# Patient Record
Sex: Female | Born: 1980 | Race: White | Hispanic: No | Marital: Married | Smoking: Never smoker
Health system: Southern US, Community
[De-identification: ages and names within clinical notes are randomized; demographics above are authoritative.]

## PROBLEM LIST (undated history)

## (undated) ENCOUNTER — Inpatient Hospital Stay (HOSPITAL_COMMUNITY): Payer: Self-pay

## (undated) DIAGNOSIS — O4290 Premature rupture of membranes, unspecified as to length of time between rupture and onset of labor, unspecified weeks of gestation: Secondary | ICD-10-CM

## (undated) DIAGNOSIS — N809 Endometriosis, unspecified: Secondary | ICD-10-CM

## (undated) DIAGNOSIS — O99019 Anemia complicating pregnancy, unspecified trimester: Secondary | ICD-10-CM

## (undated) DIAGNOSIS — N979 Female infertility, unspecified: Secondary | ICD-10-CM

## (undated) DIAGNOSIS — D62 Acute posthemorrhagic anemia: Secondary | ICD-10-CM

## (undated) DIAGNOSIS — O133 Gestational [pregnancy-induced] hypertension without significant proteinuria, third trimester: Secondary | ICD-10-CM

## (undated) DIAGNOSIS — D509 Iron deficiency anemia, unspecified: Secondary | ICD-10-CM

## (undated) DIAGNOSIS — J45909 Unspecified asthma, uncomplicated: Secondary | ICD-10-CM

## (undated) HISTORY — PX: WISDOM TOOTH EXTRACTION: SHX21

## (undated) HISTORY — DX: Endometriosis, unspecified: N80.9

## (undated) HISTORY — PX: CHOLECYSTECTOMY: SHX55

## (undated) HISTORY — PX: LAPAROSCOPY: SHX197

## (undated) HISTORY — PX: NASAL SINUS SURGERY: SHX719

## (undated) HISTORY — PX: KNEE ARTHROSCOPY: SHX127

---

## 2013-02-23 ENCOUNTER — Other Ambulatory Visit: Payer: Self-pay | Admitting: Obstetrics and Gynecology

## 2013-02-23 DIAGNOSIS — N6452 Nipple discharge: Secondary | ICD-10-CM

## 2013-03-13 ENCOUNTER — Other Ambulatory Visit: Payer: Self-pay

## 2013-03-28 ENCOUNTER — Other Ambulatory Visit: Payer: Self-pay

## 2013-03-28 ENCOUNTER — Inpatient Hospital Stay: Admission: RE | Admit: 2013-03-28 | Payer: Self-pay | Source: Ambulatory Visit

## 2013-08-13 ENCOUNTER — Encounter (HOSPITAL_COMMUNITY): Payer: Self-pay | Admitting: Emergency Medicine

## 2013-08-13 ENCOUNTER — Emergency Department (HOSPITAL_COMMUNITY): Payer: 59

## 2013-08-13 ENCOUNTER — Emergency Department (HOSPITAL_COMMUNITY)
Admission: EM | Admit: 2013-08-13 | Discharge: 2013-08-13 | Disposition: A | Discharge status: Home / Self Care | Payer: 59 | Attending: Emergency Medicine | Admitting: Emergency Medicine

## 2013-08-13 DIAGNOSIS — J45909 Unspecified asthma, uncomplicated: Secondary | ICD-10-CM | POA: Insufficient documentation

## 2013-08-13 DIAGNOSIS — J029 Acute pharyngitis, unspecified: Secondary | ICD-10-CM | POA: Insufficient documentation

## 2013-08-13 DIAGNOSIS — J3489 Other specified disorders of nose and nasal sinuses: Secondary | ICD-10-CM | POA: Insufficient documentation

## 2013-08-13 DIAGNOSIS — R69 Illness, unspecified: Secondary | ICD-10-CM

## 2013-08-13 DIAGNOSIS — B9789 Other viral agents as the cause of diseases classified elsewhere: Secondary | ICD-10-CM | POA: Insufficient documentation

## 2013-08-13 DIAGNOSIS — R05 Cough: Secondary | ICD-10-CM | POA: Insufficient documentation

## 2013-08-13 DIAGNOSIS — R51 Headache: Secondary | ICD-10-CM | POA: Insufficient documentation

## 2013-08-13 DIAGNOSIS — R059 Cough, unspecified: Secondary | ICD-10-CM | POA: Insufficient documentation

## 2013-08-13 DIAGNOSIS — J111 Influenza due to unidentified influenza virus with other respiratory manifestations: Secondary | ICD-10-CM

## 2013-08-13 DIAGNOSIS — IMO0002 Reserved for concepts with insufficient information to code with codable children: Secondary | ICD-10-CM | POA: Insufficient documentation

## 2013-08-13 DIAGNOSIS — IMO0001 Reserved for inherently not codable concepts without codable children: Secondary | ICD-10-CM | POA: Insufficient documentation

## 2013-08-13 HISTORY — DX: Unspecified asthma, uncomplicated: J45.909

## 2013-08-13 MED ORDER — GUAIFENESIN ER 1200 MG PO TB12: 1.0000 | ORAL_TABLET | Freq: Two times a day (BID) | ORAL | Status: DC

## 2013-08-13 MED ORDER — PROMETHAZINE-DM 6.25-15 MG/5ML PO SYRP: 5.0000 mL | ORAL_SOLUTION | Freq: Four times a day (QID) | ORAL | Status: DC | PRN

## 2013-08-13 MED ORDER — IBUPROFEN 800 MG PO TABS: 800.0000 mg | ORAL_TABLET | Freq: Three times a day (TID) | ORAL | Status: DC | PRN

## 2013-08-13 NOTE — ED Provider Notes (Signed)
CSN: [id number]     Arrival date & time 08/13/13  1956 History   First MD Initiated Contact with Patient 08/13/13 2057     Chief Complaint  Patient presents with  . Fever    pre tylenol at home   (Consider location/radiation/quality/duration/timing/severity/associated sxs/prior Treatment) HPI Patient presents emergency department with headache, cough, runny nose, sore throat body aches, and fever, that started one day ago.  The patient, states, that she does not have chest pain, shortness of breath, vomiting, diarrhea, weakness, dizziness, blurred vision, rash, or syncope.  Patient, states she took some expiratory arrival.  Patient, states nothing seems to make her condition, better or worse Past Medical History  Diagnosis Date  . Asthma    Past Surgical History  Procedure Laterality Date  . Nasal sinus surgery    . Cesarean section    . Cholecystectomy     History reviewed. No pertinent family history. History  Substance Use Topics  . Smoking status: Never Smoker   . Smokeless tobacco: Not on file  . Alcohol Use: No   OB History   Grav Para Term Preterm Abortions TAB SAB Ect Mult Living                 Review of Systems All other systems negative except as documented in the HPI. All pertinent positives and negatives as reviewed in the HPI. Allergies  Augmentin; Biaxin; and Keflex  Home Medications   Current Outpatient Rx  Name  Route  Sig  Dispense  Refill  . budesonide-formoterol (SYMBICORT) 80-4.5 MCG/ACT inhaler   Inhalation   Inhale 2 puffs into the lungs 2 (two) times daily.         . Prenatal Multivit-Min-Fe-FA (PRE-NATAL PO)   Oral   Take 1 tablet by mouth daily.          BP 135/57  Pulse 91  Temp(Src) 98.8 F (37.1 C) (Oral)  Resp 15  Ht 5\' 8"  (1.727 m)  Wt 200 lb (90.719 kg)  BMI 30.42 kg/m2  SpO2 98%  LMP 08/10/2013 Physical Exam  Nursing note and vitals reviewed. Constitutional: She is oriented to person, place, and time. She appears  well-developed and well-nourished. No distress.  HENT:  Head: Normocephalic and atraumatic.  Mouth/Throat: Oropharynx is clear and moist.  Eyes: Pupils are equal, round, and reactive to light.  Neck: Normal range of motion. Neck supple.  Cardiovascular: Normal rate, regular rhythm and normal heart sounds.  Exam reveals no gallop and no friction rub.   No murmur heard. Pulmonary/Chest: Effort normal and breath sounds normal. No respiratory distress. She has no wheezes.  Neurological: She is alert and oriented to person, place, and time.  Skin: Skin is warm and dry.    ED Course  Procedures (including critical care time) Labs Review Labs Reviewed - No data to display Imaging Review Dg Chest 2 View  08/13/2013   CLINICAL DATA:  Cough and fever.  EXAM: CHEST  2 VIEW  COMPARISON:  None.  FINDINGS: The heart size and mediastinal contours are within normal limits. Both lungs are clear. The visualized skeletal structures are unremarkable. Cholecystectomy changes.  IMPRESSION: No active cardiopulmonary disease.   Electronically Signed   By: Tiburcio Pea M.D.   On: 08/13/2013 23:05   patient will be treated for influenza-like illness.  Told to return here as needed.  I have advised her to increase her fluid intake, and rest as much as possible.  Patient is advised followup with her primary  care Dr.    Carlyle Dollyhristopher W Caron Tardif, PA-C 08/13/13 [phone]

## 2013-08-13 NOTE — Discharge Instructions (Signed)
Return here as needed.  Increase your fluid intake, and rest as much possible. Your Chest x-ray did not show any abnormality

## 2013-08-13 NOTE — ED Provider Notes (Signed)
Medical screening examination/treatment/procedure(s) were performed by non-physician practitioner and as supervising physician I was immediately available for consultation/collaboration.   Celene KrasJon R Izik Bingman, MD 08/13/13 [phone]

## 2013-08-13 NOTE — ED Notes (Addendum)
Patient requested to undress and change into a gown. Given warm blanket. Patient does not want IV fluids, PA notified.

## 2013-08-13 NOTE — ED Notes (Signed)
Patient c/o body aches, chills, cough, fever, stuffy nose x2 days. PA at bedside.

## 2013-08-13 NOTE — ED Notes (Signed)
Patient is alert and oriented x3.  She is complaining of flu like symptoms with a fever that started yesterday. She states that she started feeling bad yesterday morning.  Today she spiked a fever of 102.0 and she  Took tylenol at home and is currently afebrile.

## 2014-03-16 IMAGING — CR DG CHEST 2V
2 series · 2 of 2 positions shown · non-contrast
Comparison: None.

CLINICAL DATA: Cough and fever.

EXAM:
CHEST  2 VIEW

[w chest pa]
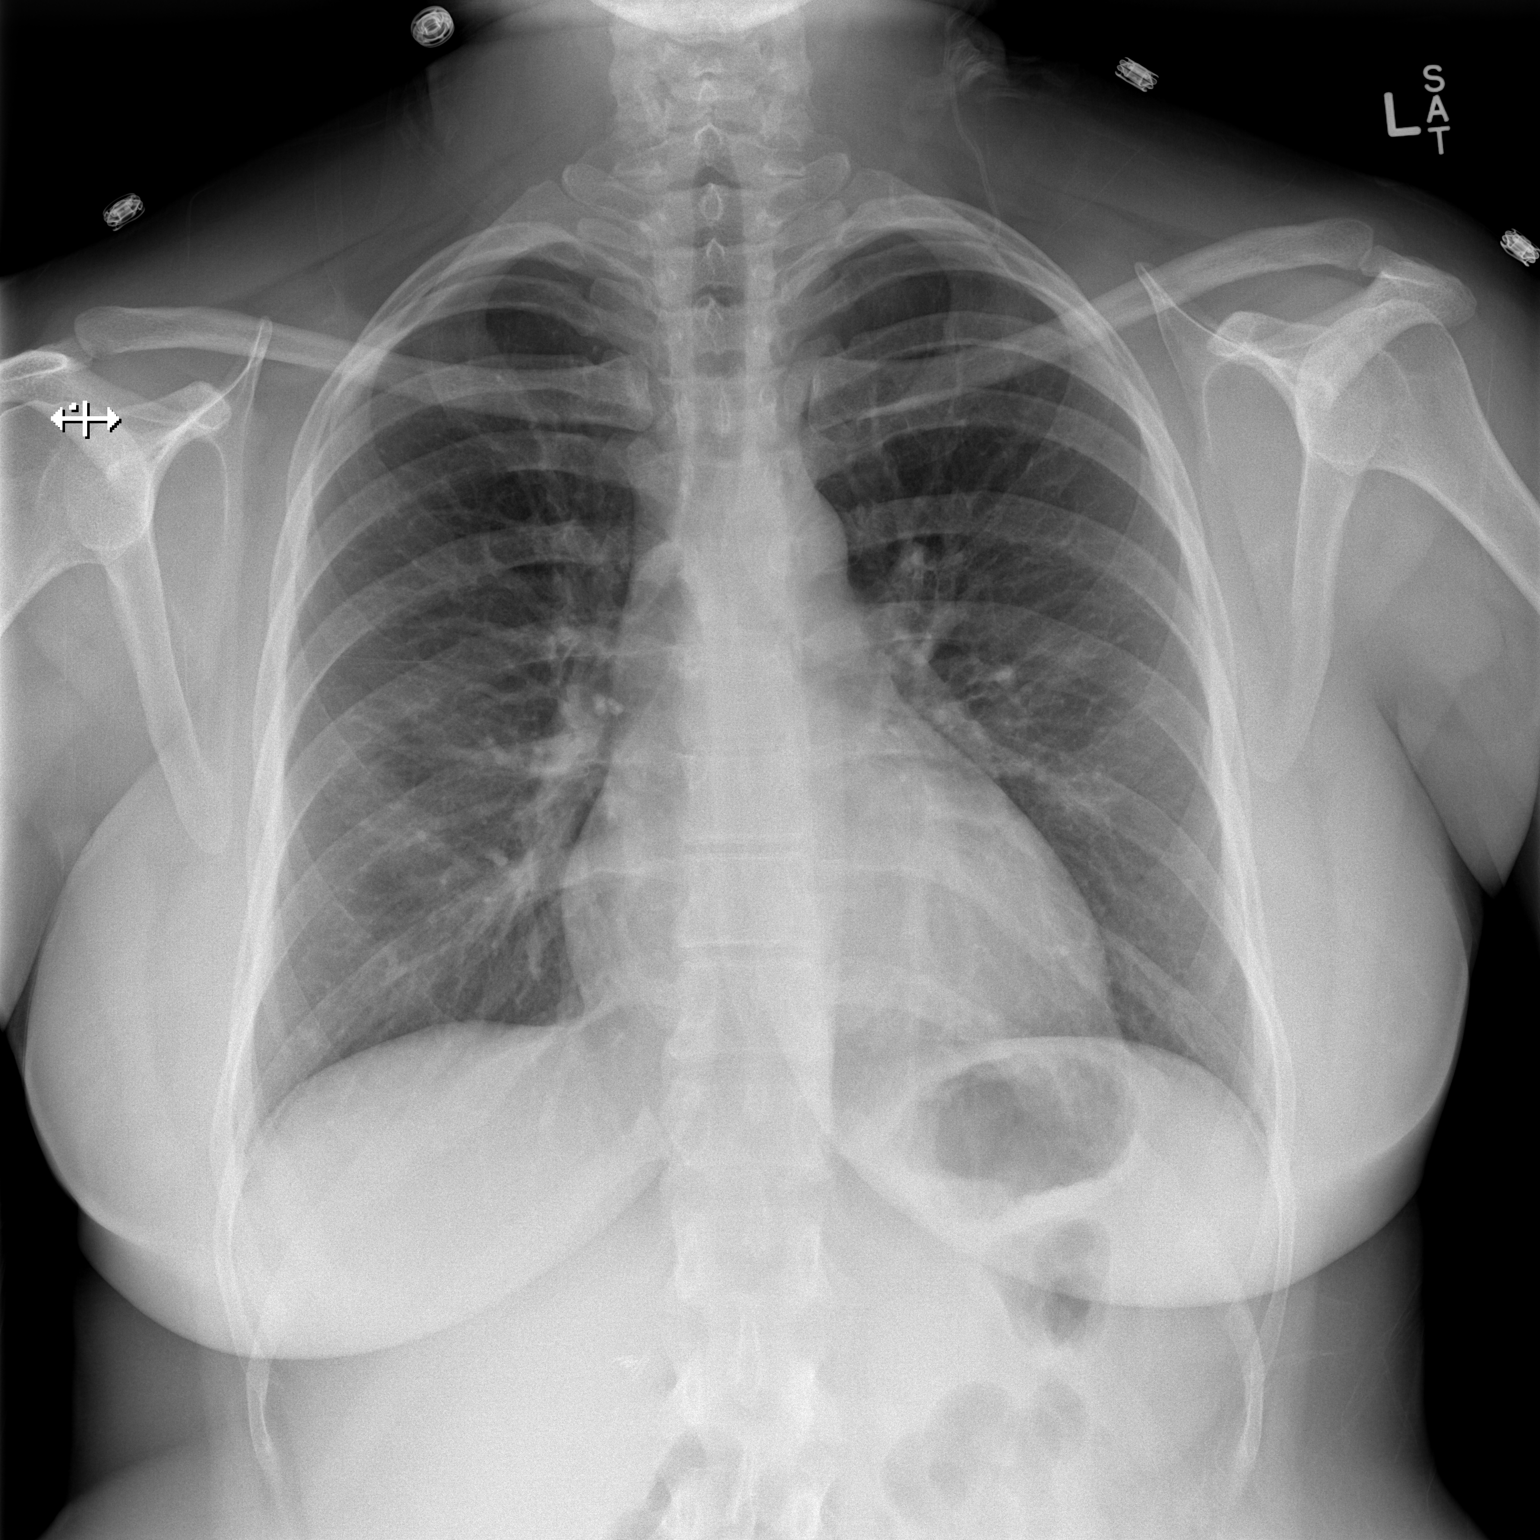

[w chest lat]
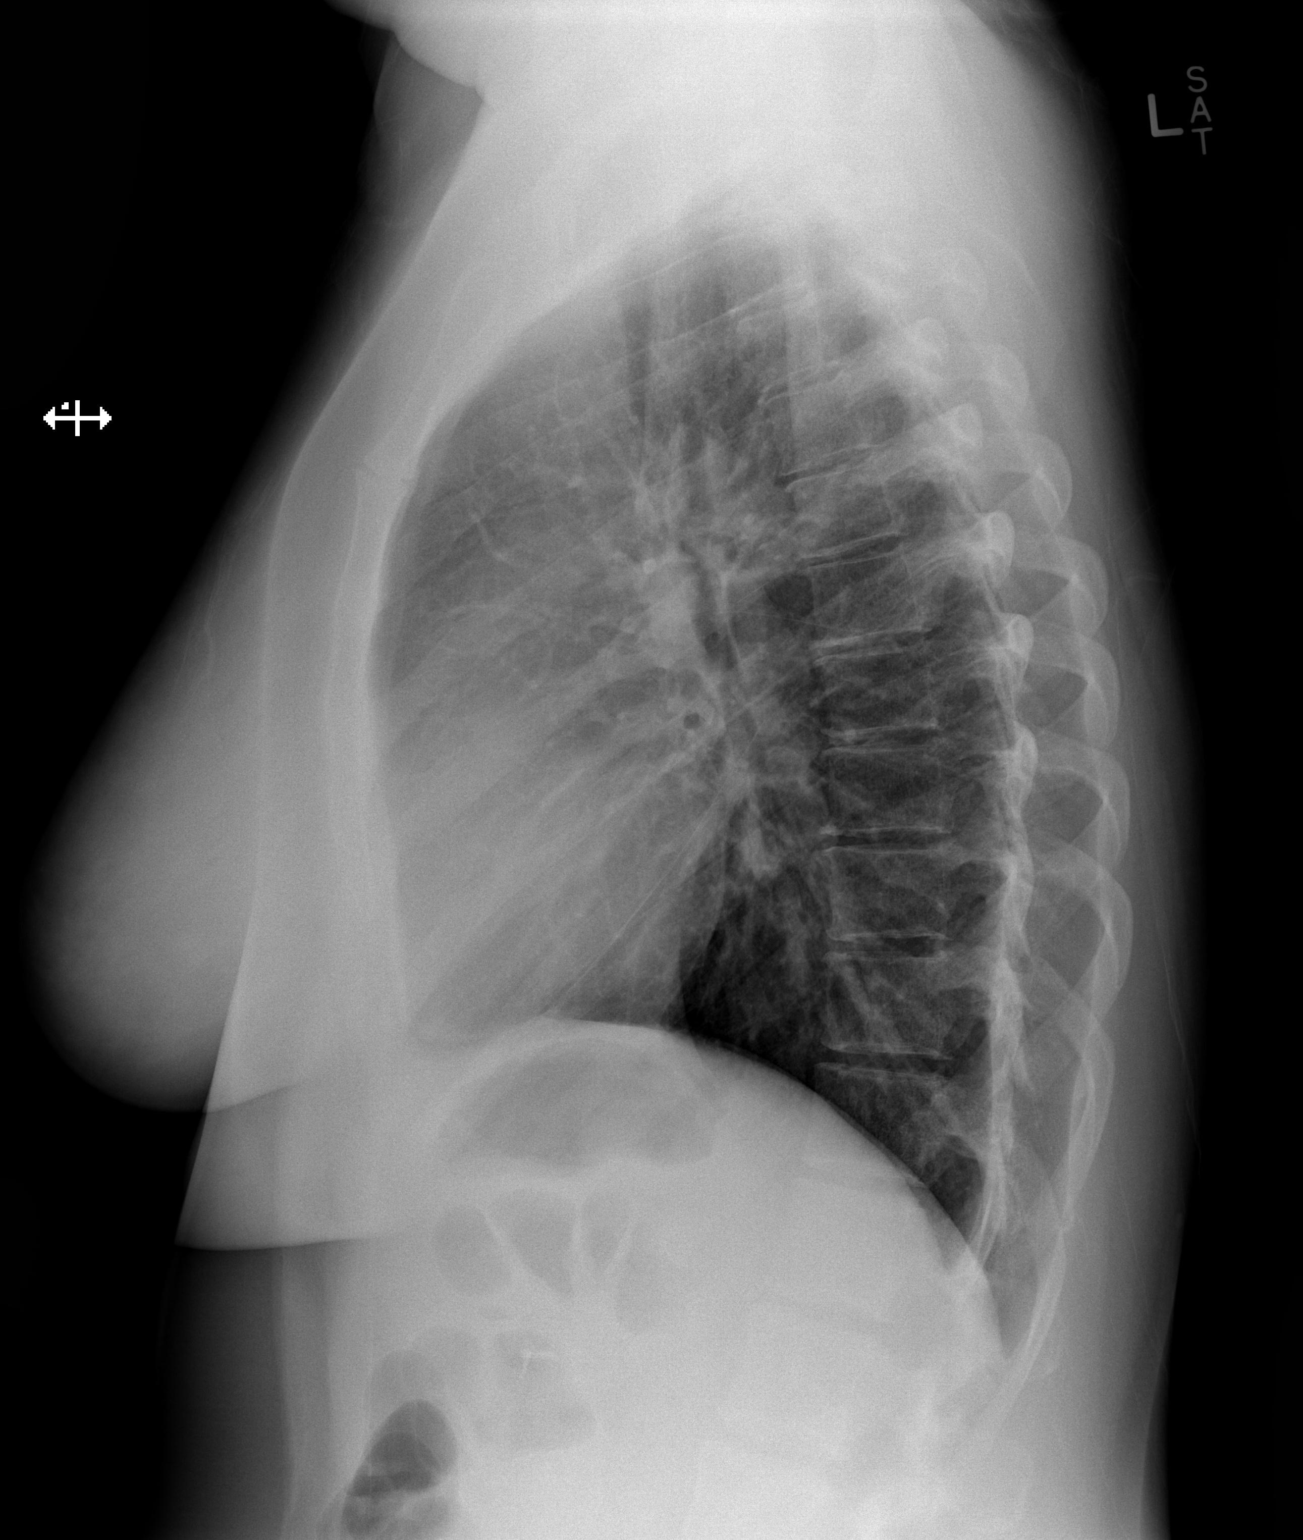

[2 of 2 positions shown; findings below may reference images not displayed]

FINDINGS: The heart size and mediastinal contours are within normal limits.
Both lungs are clear. The visualized skeletal structures are
unremarkable. Cholecystectomy changes.
IMPRESSION: No active cardiopulmonary disease.

## 2014-05-10 ENCOUNTER — Ambulatory Visit: Payer: 59 | Admitting: Family Medicine

## 2014-07-10 ENCOUNTER — Ambulatory Visit: Payer: 59 | Admitting: Family Medicine

## 2014-10-04 ENCOUNTER — Ambulatory Visit: Payer: Self-pay | Admitting: Family Medicine

## 2014-10-16 ENCOUNTER — Ambulatory Visit (INDEPENDENT_AMBULATORY_CARE_PROVIDER_SITE_OTHER): Payer: 59 | Admitting: Family Medicine

## 2014-10-16 ENCOUNTER — Encounter: Payer: Self-pay | Admitting: Family Medicine

## 2014-10-16 VITALS — BP 128/84 | HR 78 | Temp 98.1°F | Ht 66.25 in | Wt 226.8 lb

## 2014-10-16 DIAGNOSIS — G43009 Migraine without aura, not intractable, without status migrainosus: Secondary | ICD-10-CM

## 2014-10-16 DIAGNOSIS — J302 Other seasonal allergic rhinitis: Secondary | ICD-10-CM

## 2014-10-16 DIAGNOSIS — J4520 Mild intermittent asthma, uncomplicated: Secondary | ICD-10-CM

## 2014-10-16 DIAGNOSIS — E669 Obesity, unspecified: Secondary | ICD-10-CM

## 2014-10-16 DIAGNOSIS — G43909 Migraine, unspecified, not intractable, without status migrainosus: Secondary | ICD-10-CM | POA: Insufficient documentation

## 2014-10-16 NOTE — Progress Notes (Signed)
Pre visit review using our clinic review tool, if applicable. No additional management support is needed unless otherwise documented below in the visit note. 

## 2014-10-16 NOTE — Patient Instructions (Signed)
We recommend the following healthy lifestyle measures: - eat a healthy diet consisting of lots of vegetables, fruits, beans, nuts, seeds, healthy meats such as white chicken and fish and whole grains.  - avoid fried foods, fast food, processed foods, sodas, red meet and other fattening foods.  - get a least 150 minutes of aerobic exercise per week.     

## 2014-10-16 NOTE — Progress Notes (Signed)
HPI:  Diane Lester is here to establish care. Moved here a few years ago. Last PCP and physical: sees Dr. Algie Coffer - UTD on physicals there - trying to conceive. She will check lipids and diabetes screening at her upcoming physical. She has done this with her job as well last year and normal.  Has the following chronic problems that require follow up and concerns today:  Migraines: -essential oils and tylenol help -on topamax in the past but does not want to take this as is trying to conceive -chronic, 2 per month now and this is normal for her, usually posterior, some nausea with them -denies change in migraines, vision changes  Obesity: -has lost sig weight since christmas - has changed diet dramatically, cut out sugar, and increased activity -feels great  Seasonal allergies/asthma: -uses flonase intermittently, singulair at times -doing ok right now -has mild asthma symptoms a few times per year and uses albuterol for this -no SOB, DOE, wheezing now  ROS negative for unless reported above: fevers, unintentional weight loss, hearing or vision loss, chest pain, palpitations, struggling to breath, hemoptysis, melena, hematochezia, hematuria, falls, loc, si, thoughts of self harm  Past Medical History  Diagnosis Date  . Asthma     Past Surgical History  Procedure Laterality Date  . Nasal sinus surgery    . Cesarean section    . Cholecystectomy      Family History  Problem Relation Age of Onset  . Diabetes Mother   . Diabetes Maternal Grandfather   . Diabetes Paternal Grandfather     History   Social History  . Marital Status: Married    Spouse Name: N/A  . Number of Children: N/A  . Years of Education: N/A   Social History Main Topics  . Smoking status: Never Smoker   . Smokeless tobacco: Not on file  . Alcohol Use: No  . Drug Use: Not on file  . Sexual Activity: Not on file   Other Topics Concern  . None   Social History Narrative   Work or School:  homemaker      Home Situation: lives with husband and 3 yo son      Spiritual Beliefs: Christian      Lifestyle: starting to get regular exercise; eating healthy           Current outpatient prescriptions:  .  Prenatal Multivit-Min-Fe-FA (PRE-NATAL PO), Take 1 tablet by mouth daily., Disp: , Rfl:   EXAM:  Filed Vitals:   10/16/14 1606  BP: 128/84  Pulse: 78  Temp: 98.1 F (36.7 C)    Body mass index is 36.32 kg/(m^2).  GENERAL: vitals reviewed and listed above, alert, oriented, appears well hydrated and in no acute distress  HEENT: atraumatic, conjunttiva clear, no obvious abnormalities on inspection of external nose and ears  NECK: no obvious masses on inspection  LUNGS: clear to auscultation bilaterally, no wheezes, rales or rhonchi, good air movement  CV: HRRR, no peripheral edema  MS: moves all extremities without noticeable abnormality  PSYCH: pleasant and cooperative, no obvious depression or anxiety  ASSESSMENT AND PLAN:  Discussed the following assessment and plan:  Migraine without aura and without status migrainosus, not intractable -discussed options, she will discuss further with ob, tylenol and essential oils are helping so would stick with this as symptoms are infrequent  Obesity -congratulated on lifestyle changes  Asthma, mild intermittent, uncomplicated Seasonal allergies   -We reviewed the PMH, PSH, FH, SH, Meds and Allergies. -We  provided refills for any medications we will prescribe as needed. -We addressed current concerns per orders and patient instructions. -We have asked for records for pertinent exams, studies, vaccines and notes from previous providers. -We have advised patient to follow up per instructions below.   -Patient advised to return or notify a doctor immediately if symptoms worsen or persist or new concerns arise.  There are no Patient Instructions on file for this visit.   Kriste BasqueKIM, Safir Michalec R.

## 2014-11-13 ENCOUNTER — Encounter: Payer: Self-pay | Admitting: Obstetrics & Gynecology

## 2014-11-13 ENCOUNTER — Ambulatory Visit (INDEPENDENT_AMBULATORY_CARE_PROVIDER_SITE_OTHER): Payer: 59 | Admitting: Obstetrics & Gynecology

## 2014-11-13 VITALS — BP 127/82 | HR 89 | Resp 16 | Ht 66.25 in | Wt 225.0 lb

## 2014-11-13 DIAGNOSIS — N809 Endometriosis, unspecified: Secondary | ICD-10-CM | POA: Diagnosis not present

## 2014-11-13 DIAGNOSIS — Z8759 Personal history of other complications of pregnancy, childbirth and the puerperium: Secondary | ICD-10-CM

## 2014-11-13 DIAGNOSIS — Z124 Encounter for screening for malignant neoplasm of cervix: Secondary | ICD-10-CM

## 2014-11-13 DIAGNOSIS — Z1151 Encounter for screening for human papillomavirus (HPV): Secondary | ICD-10-CM

## 2014-11-13 DIAGNOSIS — Z01419 Encounter for gynecological examination (general) (routine) without abnormal findings: Secondary | ICD-10-CM | POA: Diagnosis not present

## 2014-11-13 NOTE — Progress Notes (Signed)
  Subjective:     Diane Lester is a 34 y.o. female here for a routine exam.  Current complaints: establish care, discuss conceiving.  Pt hsa been having unprotected sex for 3 months but without regular timed intercourse.  Pt has history of endometriosis with 2 laparoscopies in [place].  Pt did not have problems conceiving with first child in 2012.  Pt has regular menses q 31-33 days.  No pain with menses.  Pt had preeclampsia with first pregnancy at primary c/s at 36 weeks.  No labor.  Not breech.  Pt denies GDM.  Personal health questionnaire reviewed: yes.   Gynecologic History Patient's last menstrual period was 10/17/2014. Contraception: none Last Pap: 2014. Results were: normal Last mammogram: n/a  Obstetric History OB History  Gravida Para Term Preterm AB SAB TAB Ectopic Multiple Living  1 1  1      1     # Outcome Date GA Lbr Len/2nd Weight Sex Delivery Anes PTL Lv  1 Preterm 08/10/11 [id number]    [id number]-[id number]        Comments: PIH       The following portions of the patient's history were reviewed and updated as appropriate: allergies, current medications, past family history, past medical history, past social history, past surgical history and problem list.  Review of Systems Pertinent items are noted in HPI.    Objective:   Filed Vitals:   11/13/14 0842  BP: 127/82  Pulse: 89  Resp: 16  Height: 5' 6.25" (1.683 m)  Weight: 225 lb (102.059 kg)   Vitals:  WNL General appearance: alert, cooperative and no distress Head: Normocephalic, without obvious abnormality, atraumatic Eyes: negative Throat: lips, mucosa, and tongue normal; teeth and gums normal Lungs: clear to auscultation bilaterally Breasts: normal appearance, no masses or tenderness, No nipple retraction or dimpling, No nipple discharge or bleeding Heart: regular rate and rhythm Abdomen: soft, non-tender; bowel sounds normal; no masses,  no organomegaly  Pelvic:  External Genitalia:  Tanner V, no  lesion Urethra:  No prolapse Vagina:  Pink, normal rugae, no blood or discharge Cervix:  No CMT, no lesion Uterus:  Normal size and contour, non tender; slightly retroverted. Adnexa:  Normal, no masses, non tender  Extremities: no edema, redness or tenderness in the calves or thighs Skin: no lesions or rash Lymph nodes: Axillary adenopathy: none         Assessment:    Healthy female exam.    Plan:    Education reviewed: low fat, low cholesterol diet and skin cancer screening. Contraception: none. Follow up in: 1 year. timed intercourse for 6 months  PNV Continue weight loss (lost 40+ pounds this year) Pap with co testing--next not due for 3-5 years.

## 2014-11-13 NOTE — Addendum Note (Signed)
Addended by: Granville LewisLARK, LORA L on: 11/13/2014 09:58 AM   Modules accepted: Orders

## 2014-11-15 LAB — CYTOLOGY - PAP

## 2014-12-10 ENCOUNTER — Encounter: Payer: 59 | Admitting: Obstetrics & Gynecology

## 2014-12-29 ENCOUNTER — Encounter: Payer: Self-pay | Admitting: Family Medicine

## 2014-12-29 ENCOUNTER — Ambulatory Visit (INDEPENDENT_AMBULATORY_CARE_PROVIDER_SITE_OTHER): Payer: 59 | Admitting: Family Medicine

## 2014-12-29 VITALS — BP 120/86 | HR 73 | Temp 98.2°F | Ht 66.25 in | Wt 227.5 lb

## 2014-12-29 DIAGNOSIS — J0100 Acute maxillary sinusitis, unspecified: Secondary | ICD-10-CM | POA: Diagnosis not present

## 2014-12-29 DIAGNOSIS — J019 Acute sinusitis, unspecified: Secondary | ICD-10-CM | POA: Insufficient documentation

## 2014-12-29 MED ORDER — LEVOFLOXACIN 500 MG PO TABS: 500.0000 mg | ORAL_TABLET | Freq: Every day | ORAL | Status: DC

## 2014-12-29 NOTE — Progress Notes (Signed)
Subjective:    Patient ID: Diane Lester, female    DOB: [birth year], 34 y.o.   MRN: [id number]  HPI Here with uri symptoms for 9 days   Tried tylenol cold /sins and throat spray and cough med   Not getting better   ST with drainage (lots of drainage) Pressure in ears with congestion  Nasal d/c with colored mucous drainage  Prod cough -also colored mucous   Fever - low grade 100.5 at most   Coughing all night   34 year old is sick   Patient Active Problem List   Diagnosis Date Noted  . Endometriosis determined by laparoscopy 11/13/2014  . History of pre-eclampsia 11/13/2014  . Migraine 10/16/2014  . Obesity 10/16/2014  . Asthma, mild intermittent 10/16/2014  . Seasonal allergies 10/16/2014   Past Medical History  Diagnosis Date  . Asthma   . Endometriosis    Past Surgical History  Procedure Laterality Date  . Nasal sinus surgery    . Cesarean section    . Cholecystectomy    . Wisdom tooth extraction    . Knee arthroscopy Left   . Laparoscopy      twice   History  Substance Use Topics  . Smoking status: Never Smoker   . Smokeless tobacco: Never Used  . Alcohol Use: No   Family History  Problem Relation Age of Onset  . Diabetes Mother   . Diabetes Maternal Grandfather   . Diabetes Paternal Grandfather   . Heart disease Maternal Grandfather   . Aneurysm Paternal Aunt    Allergies  Allergen Reactions  . Augmentin [Amoxicillin-Pot Clavulanate] Nausea And Vomiting  . Biaxin [Clarithromycin] Nausea And Vomiting  . Keflex [Cephalexin] Nausea And Vomiting   Current Outpatient Prescriptions on File Prior to Visit  Medication Sig Dispense Refill  . Prenatal Multivit-Min-Fe-FA (PRE-NATAL PO) Take 1 tablet by mouth daily.    Marland Kitchen. albuterol (PROVENTIL) (5 MG/ML) 0.5% nebulizer solution Take 2.5 mg by nebulization every 6 (six) hours as needed for wheezing or shortness of breath.    . budesonide-formoterol (SYMBICORT) 160-4.5 MCG/ACT inhaler Inhale 2 puffs into  the lungs 2 (two) times daily.     No current facility-administered medications on file prior to visit.    Review of Systems Review of Systems  Constitutional: Negative for fever, appetite change, and unexpected weight change.  ENT pos for cong and rhinorrhea and facial pain and ST Eyes: Negative for pain and visual disturbance.  Respiratory: Negative for wheeze  and shortness of breath.   Cardiovascular: Negative for cp or palpitations    Gastrointestinal: Negative for nausea, diarrhea and constipation.  Genitourinary: Negative for urgency and frequency.  Skin: Negative for pallor or rash   Neurological: Negative for weakness, light-headedness, numbness and headaches.  Hematological: Negative for adenopathy. Does not bruise/bleed easily.  Psychiatric/Behavioral: Negative for dysphoric mood. The patient is not nervous/anxious.         Objective:   Physical Exam  Constitutional: She appears well-developed and well-nourished. No distress.  obese and well appearing   HENT:  Head: Normocephalic and atraumatic.  Right Ear: External ear normal.  Left Ear: External ear normal.  Mouth/Throat: Oropharynx is clear and moist. No oropharyngeal exudate.  Nares are injected and congested  Bilateral maxillary sinus tenderness  Post nasal drip   Eyes: Conjunctivae and EOM are normal. Pupils are equal, round, and reactive to light. Right eye exhibits no discharge. Left eye exhibits no discharge.  Neck: Normal range of motion. Neck supple.  Cardiovascular: Normal rate and regular rhythm.   Pulmonary/Chest: Effort normal and breath sounds normal. No respiratory distress. She has no wheezes. She has no rales.  Good air exch   Lymphadenopathy:    She has no cervical adenopathy.  Neurological: She is alert. No cranial nerve deficit.  Skin: Skin is warm and dry. No rash noted.  Psychiatric: She has a normal mood and affect.          Assessment & Plan:   Problem List Items Addressed This  Visit    Acute sinusitis - Primary    In pt with hx of 3 prev sinus surgeries - 9 d s/p uri  Cover with levaquin (see med allergies) Disc symptomatic care - see instructions on AVS  Update if not starting to improve in a week or if worsening         Relevant Medications   levofloxacin (LEVAQUIN) 500 MG tablet

## 2014-12-29 NOTE — Patient Instructions (Signed)
I think you have a sinus infection  Fluids/rest/ breathe steam/ nasal saline spray/ netti pot  Take levaquin as directed  mucinex DM for cough and congestion

## 2014-12-29 NOTE — Progress Notes (Signed)
Pre visit review using our clinic review tool, if applicable. No additional management support is needed unless otherwise documented below in the visit note. 

## 2014-12-29 NOTE — Assessment & Plan Note (Signed)
In pt with hx of 3 prev sinus surgeries - 9 d s/p uri  Cover with levaquin (see med allergies) Disc symptomatic care - see instructions on AVS  Update if not starting to improve in a week or if worsening

## 2015-06-18 ENCOUNTER — Encounter: Payer: Self-pay | Admitting: Family Medicine

## 2015-06-18 ENCOUNTER — Ambulatory Visit (INDEPENDENT_AMBULATORY_CARE_PROVIDER_SITE_OTHER): Payer: 59 | Admitting: Family Medicine

## 2015-06-18 VITALS — BP 122/84 | HR 99 | Temp 97.7°F | Ht 66.25 in | Wt 215.7 lb

## 2015-06-18 DIAGNOSIS — R197 Diarrhea, unspecified: Secondary | ICD-10-CM

## 2015-06-18 DIAGNOSIS — Z3200 Encounter for pregnancy test, result unknown: Secondary | ICD-10-CM | POA: Diagnosis not present

## 2015-06-18 LAB — POCT URINE PREGNANCY: PREG TEST UR: NEGATIVE

## 2015-06-18 NOTE — Progress Notes (Addendum)
HPI:  Acute visit for:  Diarrhea: -stomach bug went through their house and every one got it about 2 weeks ago with diarrhea, nausea and vomiting -she got better then started having some diarrhea agin for the last 4-5 days -2-3 episodes of loose to watery diarrhea per day with minimal cramping before BMs -no nausea, vomiting, persistent abd pain, fevers, malaise, melena, hematochezia, recent travel or abx -[facility] Oct 16th, she wants to check Upreg -Did Femara with gyn 1 month ago for fertility  ROS: See pertinent positives and negatives per HPI.  Past Medical History  Diagnosis Date  . Asthma   . Endometriosis     Past Surgical History  Procedure Laterality Date  . Nasal sinus surgery    . Cesarean section    . Cholecystectomy    . Wisdom tooth extraction    . Knee arthroscopy Left   . Laparoscopy      twice    Family History  Problem Relation Age of Onset  . Diabetes Mother   . Diabetes Maternal Grandfather   . Diabetes Paternal Grandfather   . Heart disease Maternal Grandfather   . Aneurysm Paternal Aunt     Social History   Social History  . Marital Status: Married    Spouse Name: N/A  . Number of Children: N/A  . Years of Education: N/A   Occupational History  . student    Social History Main Topics  . Smoking status: Never Smoker   . Smokeless tobacco: Never Used  . Alcohol Use: No  . Drug Use: No  . Sexual Activity:    Partners: Male   Other Topics Concern  . None   Social History Narrative   Work or School: homemaker      Home Situation: lives with husband and 3 yo son      Spiritual Beliefs: Christian      Lifestyle: starting to get regular exercise; eating healthy           Current outpatient prescriptions:  .  albuterol (PROVENTIL) (5 MG/ML) 0.5% nebulizer solution, Take 2.5 mg by nebulization every 6 (six) hours as needed for wheezing or shortness of breath., Disp: , Rfl:  .  budesonide-formoterol (SYMBICORT) 160-4.5 MCG/ACT  inhaler, Inhale 2 puffs into the lungs 2 (two) times daily., Disp: , Rfl:  .  Prenatal Multivit-Min-Fe-FA (PRE-NATAL PO), Take 1 tablet by mouth daily., Disp: , Rfl:   EXAM:  Filed Vitals:   06/18/15 1010  BP: 122/84  Pulse: 99  Temp: 97.7 F (36.5 C)    Body mass index is 34.54 kg/(m^2).  GENERAL: vitals reviewed and listed above, alert, oriented, appears well hydrated and in no acute distress  HEENT: atraumatic, conjunttiva clear, no obvious abnormalities on inspection of external nose and ears  NECK: no obvious masses on inspection  LUNGS: clear to auscultation bilaterally, no wheezes, rales or rhonchi, good air movement  ABD: BS+, soft, NTTP, no rebound or guarding  CV: HRRR, no peripheral edema  MS: moves all extremities without noticeable abnormality  PSYCH: pleasant and cooperative, no obvious depression or anxiety  ASSESSMENT AND PLAN:  Discussed the following assessment and plan:  Diarrhea, unspecified type  -suspect viral gastroenteritis -discussed other potential etiologies and offered to do stool studies - we opted to hold of on this and if upreg neg do probiotic, imodium prn and no dairy with follow up if any symptoms worsen or persist -Patient advised to return or notify a doctor immediately if symptoms worsen  or persist or new concerns arise.  Patient Instructions  BEFORE YOU LEAVE: -upreg test  No dairy for 1 week  Plenty of fluids with electrolytes  Imodium (loperamide) for diarrhea  Probiotic (Align or Culturelle) daily for 3 weeks  Follow up if worsening, new symptoms or if symptoms persist     KIM, HANNAH R.

## 2015-06-18 NOTE — Patient Instructions (Addendum)
BEFORE YOU LEAVE: -upreg test  No dairy for 1 week  Plenty of fluids with electrolytes  Imodium (loperamide) for diarrhea  Probiotic (Align or Culturelle) daily for 3 weeks  Follow up if worsening, new symptoms or if symptoms persist

## 2015-06-18 NOTE — Addendum Note (Signed)
Addended by: Johnella MoloneyFUNDERBURK, Roann Merk A on: 06/18/2015 10:46 AM   Modules accepted: Orders

## 2015-06-18 NOTE — Progress Notes (Signed)
Pre visit review using our clinic review tool, if applicable. No additional management support is needed unless otherwise documented below in the visit note. 

## 2015-06-26 ENCOUNTER — Ambulatory Visit (INDEPENDENT_AMBULATORY_CARE_PROVIDER_SITE_OTHER): Payer: 59 | Admitting: Family Medicine

## 2015-06-26 ENCOUNTER — Telehealth: Payer: Self-pay | Admitting: Family Medicine

## 2015-06-26 ENCOUNTER — Encounter: Payer: Self-pay | Admitting: Family Medicine

## 2015-06-26 VITALS — BP 138/88 | HR 70 | Temp 97.9°F | Ht 66.25 in | Wt 214.9 lb

## 2015-06-26 DIAGNOSIS — R197 Diarrhea, unspecified: Secondary | ICD-10-CM | POA: Diagnosis not present

## 2015-06-26 NOTE — Progress Notes (Signed)
HPI:  Acute visit for:  Diarrhea: -intermittent for about 3 weeks -everyone in her household has had a GI bug -she got better, then started having diarrhea agaion -husbnd has also had diarrhea again the last 2 days -she is doing better the last two days, but had 2 loose stools yesterday and one today preceded by minimal abd cramping -she also has an upset stomach at times when eats but no vomiting -she is on femara with gyn for infertility (last tx > 1 month ago) and period is a few days late, but gyn is following and upreg neg, they plan to do serum preg test next week if period does not come -denies: travel, fever, focal abd pain that is peristent, abx recently, hematochezia, inability to tolerated PO fluids, melena, nausea, vomiting -is taking probiotic and loperamide as needed   ROS: See pertinent positives and negatives per HPI.  Past Medical History  Diagnosis Date  . Asthma   . Endometriosis     Past Surgical History  Procedure Laterality Date  . Nasal sinus surgery    . Cesarean section    . Cholecystectomy    . Wisdom tooth extraction    . Knee arthroscopy Left   . Laparoscopy      twice    Family History  Problem Relation Age of Onset  . Diabetes Mother   . Diabetes Maternal Grandfather   . Diabetes Paternal Grandfather   . Heart disease Maternal Grandfather   . Aneurysm Paternal Aunt     Social History   Social History  . Marital Status: Married    Spouse Name: N/A  . Number of Children: N/A  . Years of Education: N/A   Occupational History  . student    Social History Main Topics  . Smoking status: Never Smoker   . Smokeless tobacco: Never Used  . Alcohol Use: No  . Drug Use: No  . Sexual Activity:    Partners: Male   Other Topics Concern  . None   Social History Narrative   Work or School: homemaker      Home Situation: lives with husband and 3 yo son      Spiritual Beliefs: Christian      Lifestyle: starting to get regular  exercise; eating healthy           Current outpatient prescriptions:  .  albuterol (PROVENTIL) (5 MG/ML) 0.5% nebulizer solution, Take 2.5 mg by nebulization every 6 (six) hours as needed for wheezing or shortness of breath., Disp: , Rfl:  .  budesonide-formoterol (SYMBICORT) 160-4.5 MCG/ACT inhaler, Inhale 2 puffs into the lungs 2 (two) times daily., Disp: , Rfl:  .  Prenatal Multivit-Min-Fe-FA (PRE-NATAL PO), Take 1 tablet by mouth daily., Disp: , Rfl:   EXAM:  Filed Vitals:   06/26/15 1025  BP: 138/88  Pulse: 70  Temp: 97.9 F (36.6 C)    Body mass index is 34.41 kg/(m^2).  GENERAL: vitals reviewed and listed above, alert, oriented, appears well hydrated and in no acute distress  HEENT: atraumatic, conjunttiva clear, no obvious abnormalities on inspection of external nose and ears, mucus membrains are moist  NECK: no obvious masses on inspection  LUNGS: clear to auscultation bilaterally, no wheezes, rales or rhonchi, good air movement  CV: HRRR, no peripheral edema  ABD: BS+, soft, NTTP  MS: moves all extremities without noticeable abnormality  PSYCH: pleasant and cooperative, no obvious depression or anxiety  ASSESSMENT AND PLAN:  Discussed the following assessment and plan:  Diarrhea, unspecified type - Plan: Stool culture, C. difficile, PCR, Ova and Parasite Exam  -pt appears well, no abnormal findings on exam and symptoms seem to be improving, no sig symptoms or signs of serious bacterial illness or other serious intra-abd pathology and suspect viral -she is trying to conceive and is anxious about any procedures or abx unless really need -she is worried about a bacterial gastroenteritis - will check stool studies -stop probiotic in case this is a side effect -oral rehydration, bland diet, no dairy -Patient advised to return or notify a doctor immediately if symptoms worsen or persist or new concerns arise.  Patient Instructions  BEFORE YOU LEAVE: -stool  sample  Stop the probiotic in case this is upsetting your stomach  Follow up with gyn as planned if no period  Follow up in 2 weeks if symptoms persist     KIM, HANNAH R.

## 2015-06-26 NOTE — Telephone Encounter (Signed)
Appointment today with Dr. Kim. 

## 2015-06-26 NOTE — Patient Instructions (Signed)
BEFORE YOU LEAVE: -stool sample  Stop the probiotic in case this is upsetting your stomach  Follow up with gyn as planned if no period  Follow up in 2 weeks if symptoms persist

## 2015-06-26 NOTE — Progress Notes (Signed)
Pre visit review using our clinic review tool, if applicable. No additional management support is needed unless otherwise documented below in the visit note. 

## 2015-06-26 NOTE — Telephone Encounter (Signed)
[facility] - Client TELEPHONE ADVICE RECORD [facility] Patient Name: Diane ColeKELLY Alen DOB: 1980-10-21 Initial Comment Caller states she was seen last week for some GI issues. She was told to call if she is still having symptoms and she is. Nurse Assessment Nurse: Roderic OvensNorth, RN, Amy Date/Time Lamount Cohen(Eastern Time): 06/26/2015 8:56:54 AM Confirm and document reason for call. If symptomatic, describe symptoms. ---CALLER STATES THAT SHE WAS SEEN LAST WEEK FOR DIARRHEA. SHE WAS TOLD TO CALL BACK. SHE IS STILL HAVING DIARRHEA. SHE IS FEELING LIKE SHE IS GETTING DEHYDRATED. IT SEEMS LIKE HER HUSBAND IS COMING DOWN WITH THE SAME SYMPTOMS. EVERY TIME SHE TRIES TO EAT SHE IS HAVING DIARRHEA. NO MUCOUS AND NO BLOOD. SHE STATES THAT SHE DID HAVE A STOMACH BUG DURING OCT 22ND - SHE GOT BETTER AND THEN THE DIARRHEA COME BACK FULL FORCE AND SHE HAS NOT BEEN ABLE TO KICK IT. NO MEDS WERE GIVEN SHE WAS TOLD TO JUST WAIT AND SEE. THE INSIDE OF HER MOUTH IS GETTING STICKY A LITTLE. ASTHMA IS HER HISTORY. SHE IS ABLE TO DRINK GATORADE. WATER IS NOT SITTING VERY WELL WITH HER . Has the patient traveled out of the country within the last 30 days? ---Not Applicable Does the patient have any new or worsening symptoms? ---Yes Will a triage be completed? ---Yes Related visit to physician within the last 2 weeks? ---Yes Does the PT have any chronic conditions? (i.e. diabetes, asthma, etc.) ---Yes List chronic conditions. ---ASTHMA Did the patient indicate they were pregnant? ---No Guidelines Guideline Title Affirmed Question Affirmed Notes Diarrhea [1] MODERATE diarrhea (e.g., 4-6 times / day more than normal) AND [2] present > 48 hours (2 days) Final Disposition User See Physician within 24 Hours FloydNorth, Charity fundraiserN, Amy Referrals REFERRED TO PCP OFFICE Disagree/Comply: Comply

## 2015-08-23 ENCOUNTER — Emergency Department (HOSPITAL_COMMUNITY): Payer: 59

## 2015-08-23 ENCOUNTER — Emergency Department (HOSPITAL_COMMUNITY)
Admission: EM | Admit: 2015-08-23 | Discharge: 2015-08-23 | Disposition: A | Discharge status: Home / Self Care | Payer: 59 | Attending: Emergency Medicine | Admitting: Emergency Medicine

## 2015-08-23 ENCOUNTER — Encounter (HOSPITAL_COMMUNITY): Payer: Self-pay | Admitting: Emergency Medicine

## 2015-08-23 DIAGNOSIS — Y9289 Other specified places as the place of occurrence of the external cause: Secondary | ICD-10-CM | POA: Insufficient documentation

## 2015-08-23 DIAGNOSIS — Y998 Other external cause status: Secondary | ICD-10-CM | POA: Insufficient documentation

## 2015-08-23 DIAGNOSIS — Z79899 Other long term (current) drug therapy: Secondary | ICD-10-CM | POA: Insufficient documentation

## 2015-08-23 DIAGNOSIS — W208XXA Other cause of strike by thrown, projected or falling object, initial encounter: Secondary | ICD-10-CM | POA: Diagnosis not present

## 2015-08-23 DIAGNOSIS — J45909 Unspecified asthma, uncomplicated: Secondary | ICD-10-CM | POA: Diagnosis not present

## 2015-08-23 DIAGNOSIS — Y9389 Activity, other specified: Secondary | ICD-10-CM | POA: Insufficient documentation

## 2015-08-23 DIAGNOSIS — S4991XA Unspecified injury of right shoulder and upper arm, initial encounter: Secondary | ICD-10-CM | POA: Diagnosis not present

## 2015-08-23 DIAGNOSIS — M25511 Pain in right shoulder: Secondary | ICD-10-CM

## 2015-08-23 MED ORDER — KETOROLAC TROMETHAMINE 60 MG/2ML IM SOLN
60.0000 mg | Freq: Once | INTRAMUSCULAR | Status: AC
  Administered 2015-08-23: 60 mg via INTRAMUSCULAR
  Filled 2015-08-23: qty 2

## 2015-08-23 NOTE — ED Notes (Signed)
Patient transported to X-ray 

## 2015-08-23 NOTE — Discharge Instructions (Signed)
Shoulder Pain  The shoulder is the joint that connects your arms to your body. The bones that form the shoulder joint include the upper arm bone (humerus), the shoulder blade (scapula), and the collarbone (clavicle). The top of the humerus is shaped like a ball and fits into a rather flat socket on the scapula (glenoid cavity). A combination of muscles and strong, fibrous tissues that connect muscles to bones (tendons) support your shoulder joint and hold the ball in the socket. Small, fluid-filled sacs (bursae) are located in different areas of the joint. They act as cushions between the bones and the overlying soft tissues and help reduce friction between the gliding tendons and the bone as you move your arm. Your shoulder joint allows a wide range of motion in your arm. This range of motion allows you to do things like scratch your back or throw a ball. However, this range of motion also makes your shoulder more prone to pain from overuse and injury.  Causes of shoulder pain can originate from both injury and overuse and usually can be grouped in the following four categories:  1. Redness, swelling, and pain (inflammation) of the tendon (tendinitis) or the bursae (bursitis).  2. Instability, such as a dislocation of the joint.  3. Inflammation of the joint (arthritis).  4. Broken bone (fracture).  HOME CARE INSTRUCTIONS   1. Apply ice to the sore area.  ¨ Put ice in a plastic bag.  ¨ Place a towel between your skin and the bag.  ¨ Leave the ice on for 15-20 minutes, 3-4 times per day for the first 2 days, or as directed by your health care provider.  2. Stop using cold packs if they do not help with the pain.  3. If you have a shoulder sling or immobilizer, wear it as long as your caregiver instructs. Only remove it to shower or bathe. Move your arm as little as possible, but keep your hand moving to prevent swelling.  4. Squeeze a soft ball or foam pad as much as possible to help prevent swelling.  5. Only take  over-the-counter or prescription medicines for pain, discomfort, or fever as directed by your caregiver.  SEEK MEDICAL CARE IF:   1. Your shoulder pain increases, or new pain develops in your arm, hand, or fingers.  2. Your hand or fingers become cold and numb.  3. Your pain is not relieved with medicines.  SEEK IMMEDIATE MEDICAL CARE IF:   1. Your arm, hand, or fingers are numb or tingling.  2. Your arm, hand, or fingers are significantly swollen or turn white or blue.  MAKE SURE YOU:   1. Understand these instructions.  2. Will watch your condition.  3. Will get help right away if you are not doing well or get worse.     This information is not intended to replace advice given to you by your health care provider. Make sure you discuss any questions you have with your health care provider.     Document Released: 05/06/2005 Document Revised: 08/17/2014 Document Reviewed: 11/19/2014  Elsevier Interactive Patient Education ©2016 [employer]  Shoulder Range of Motion Exercises  Shoulder range of motion (ROM) exercises are designed to keep the shoulder moving freely. They are often recommended for people who have shoulder pain.  MOVEMENT EXERCISE  When you are able, do this exercise 5-6 days per week, or as told by your health care provider. Work toward doing 2 sets of 10 swings.  Pendulum   Exercise  How To Do This Exercise Lying Down  5. Lie face-down on a bed with your abdomen close to the side of the bed.  6. Let your arm hang over the side of the bed.  7. Relax your shoulder, arm, and hand.  8. Slowly and gently swing your arm forward and back. Do not use your neck muscles to swing your arm. They should be relaxed. If you are struggling to swing your arm, have someone gently swing it for you. When you do this exercise for the first time, swing your arm at a 15 degree angle for 15 seconds, or swing your arm 10 times. As pain lessens over time, increase the angle of the swing to 30-45 degrees.  9. Repeat steps 1-4  with the other arm.  How To Do This Exercise While Standing  6. Stand next to a sturdy chair or table and hold on to it with your hand.  ¨ Bend forward at the waist.  ¨ Bend your knees slightly.  ¨ Relax your other arm and let it hang limp.  ¨ Relax the shoulder blade of the arm that is hanging and let it drop.  ¨ While keeping your shoulder relaxed, use body motion to swing your arm in small circles. The first time you do this exercise, swing your arm for about 30 seconds or 10 times. When you do it next time, swing your arm for a little longer.  ¨ Stand up tall and relax.  ¨ Repeat steps 1-7, this time changing the direction of the circles.  7. Repeat steps 1-8 with the other arm.  STRETCHING EXERCISES  Do these exercises 3-4 times per day on 5-6 days per week or as told by your health care provider. Work toward holding the stretch for 20 seconds.  Stretching Exercise 1  4. Lift your arm straight out in front of you.  5. Bend your arm 90 degrees at the elbow (right angle) so your forearm goes across your body and looks like the letter "L."  6. Use your other arm to gently pull the elbow forward and across your body.  7. Repeat steps 1-3 with the other arm.  Stretching Exercise 2  You will need a towel or rope for this exercise.  3. Bend one arm behind your back with the palm facing outward.  4. Hold a towel with your other hand.  5. Reach the arm that holds the towel above your head, and bend that arm at the elbow. Your wrist should be behind your neck.  6. Use your free hand to grab the free end of the towel.  7. With the higher hand, gently pull the towel up behind you.  8. With the lower hand, pull the towel down behind you.  9. Repeat steps 1-6 with the other arm.  STRENGTHENING EXERCISES  Do each of these exercises at four different times of day (sessions) every day or as told by your health care provider. To begin with, repeat each exercise 5 times (repetitions). Work toward doing 3 sets of 12 repetitions or  as told by your health care provider.  Strengthening Exercise 1  You will need a light weight for this activity. As you grow stronger, you may use a heavier weight.  4. Standing with a weight in your hand, lift your arm straight out to the side until it is at the same height as your shoulder.  5. Bend your arm at 90 degrees so that your   fingers are pointing to the ceiling.  6. Slowly raise your hand until your arm is straight up in the air.  7. Repeat steps 1-3 with the other arm.  Strengthening Exercise 2  You will need a light weight for this activity. As you grow stronger, you may use a heavier weight.  1. Standing with a weight in your hand, gradually move your straight arm in an arc, starting at your side, then out in front of you, then straight up over your head.  2. Gradually move your other arm in an arc, starting at your side, then out in front of you, then straight up over your head.  3. Repeat steps 1-2 with the other arm.  Strengthening Exercise 3  You will need an elastic band for this activity. As you grow stronger, gradually increase the size of the bands or increase the number of bands that you use at one time.  1. While standing, hold an elastic band in one hand and raise that arm up in the air.  2. With your other hand, pull down the band until that hand is by your side.  3. Repeat steps 1-2 with the other arm.     This information is not intended to replace advice given to you by your health care provider. Make sure you discuss any questions you have with your health care provider.     Document Released: 04/25/2003 Document Revised: 12/11/2014 Document Reviewed: 07/23/2014  Elsevier Interactive Patient Education ©2016 Elsevier Inc.

## 2015-08-23 NOTE — ED Notes (Signed)
Pt ambulated to the treatment room, pt given ice pack for right shoulder.

## 2015-08-23 NOTE — ED Notes (Signed)
Patient states was closing her overhead door to the back of her vehicle yesterday and "my arm sort of gave out and the door fell on my arm/shoulder".   Small amount of bruising and no hematoma noted to area.  Patient states she took ibuprofen and naproxyn for pain with no relief.

## 2015-08-23 NOTE — ED Provider Notes (Signed)
CSN: [id number]     Arrival date & time 08/23/15  [id number] History  By signing my name below, I, Soijett Blue, attest that this documentation has been prepared under the direction and in the presence of Alveta HeimlichStevi Debby Clyne, PA-C Electronically Signed: Soijett Blue, ED Scribe. 08/23/2015. 10:40 AM.   Chief Complaint  Patient presents with  . Shoulder Injury   The history is provided by the patient. No language interpreter was used.   Diane Lester is a 35 y.o. female who presents to the Emergency Department complaining of worsening left shoulder injury occurring yesterday. She notes that she was pulling down the hatch of her truck when the hatch struck her left shoulder after her arm "gave out" on her. She reports that her left arm now hurts when she attempts to lift it. She states that her pain is to her left lateral shoulder and radiates to her left upper arm. Pain has been constant since last evening. She describes it as an ache. Pt is having associated symptoms of small bruise to left shoulder. She notes that she has tried 800 mg ibuprofen initially and naprosyn last night with mild relief of her symptoms. She denies numbness, tingling, and any other symptoms.   Past Medical History  Diagnosis Date  . Asthma   . Endometriosis    Past Surgical History  Procedure Laterality Date  . Nasal sinus surgery    . Cesarean section    . Cholecystectomy    . Wisdom tooth extraction    . Knee arthroscopy Left   . Laparoscopy      twice   Family History  Problem Relation Age of Onset  . Diabetes Mother   . Diabetes Maternal Grandfather   . Diabetes Paternal Grandfather   . Heart disease Maternal Grandfather   . Aneurysm Paternal Aunt    Social History  Substance Use Topics  . Smoking status: Never Smoker   . Smokeless tobacco: Never Used  . Alcohol Use: No   OB History    Gravida Para Term Preterm AB TAB SAB Ectopic Multiple Living   1 1  1      1      Review of Systems  Musculoskeletal:  Positive for arthralgias (left shoulder; left arm).  All other systems reviewed and are negative.     Allergies  Augmentin; Biaxin; and Keflex  Home Medications   Prior to Admission medications   Medication Sig Start Date End Date Taking? Authorizing Provider  Prenatal Multivit-Min-Fe-FA (PRE-NATAL PO) Take 1 tablet by mouth daily.   Yes Historical Provider, MD  albuterol (PROVENTIL) (5 MG/ML) 0.5% nebulizer solution Take 2.5 mg by nebulization every 6 (six) hours as needed for wheezing or shortness of breath.    Historical Provider, MD  budesonide-formoterol (SYMBICORT) 160-4.5 MCG/ACT inhaler Inhale 2 puffs into the lungs 2 (two) times daily.    Historical Provider, MD   BP 151/84 mmHg  Pulse 104  Temp(Src) 98.2 F (36.8 C) (Oral)  Resp 18  Ht 5\' 8"  (1.727 m)  Wt 96.707 kg  BMI 32.42 kg/m2  SpO2 100%  LMP 07/30/2015 Physical Exam  Constitutional: She appears well-developed and well-nourished. No distress.  HENT:  Head: Normocephalic and atraumatic.  Right Ear: External ear normal.  Left Ear: External ear normal.  Eyes: Conjunctivae are normal. Right eye exhibits no discharge. Left eye exhibits no discharge. No scleral icterus.  Neck: Normal range of motion.  Cardiovascular: Normal rate and intact distal pulses.   Radial pulse palpable. Cap  refill < 3 seconds  Pulmonary/Chest: Effort normal.  Musculoskeletal:       Right shoulder: She exhibits decreased range of motion and tenderness. She exhibits no swelling and no deformity.       Arms: Tenderness over right lateral humeral head and posterior shoulder as indicated in diagram. Decreased ROM secondary to pain. Pt can extend arm to approximately 60 degrees and can abduct arm to approximately 45 degrees. Passive ROM intact. No edema or deformity.   Neurological: She is alert. Coordination normal.  5/5 grip strength and biceps strength. Sensation to light touch intact throughout  Skin: Skin is warm and dry.  No wound,  ecchymosis or other skin changes noted over right shoulder  Psychiatric: She has a normal mood and affect. Her behavior is normal.  Nursing note and vitals reviewed.   ED Course  Procedures (including critical care time) DIAGNOSTIC STUDIES: Oxygen Saturation is 100% on RA, nl by my interpretation.    COORDINATION OF CARE: 9:48 AM Discussed treatment plan with pt at bedside which includes left shoulder xray and pt agreed to plan.    Labs Review Labs Reviewed - No data to display  Imaging Review Dg Shoulder Right  08/23/2015  CLINICAL DATA:  Right shoulder injury last night hit by closing trunk car EXAM: RIGHT SHOULDER - 2+ VIEW COMPARISON:  None. FINDINGS: Three views of the right shoulder submitted. No acute fracture or subluxation. Minimal degenerative changes AC joint. IMPRESSION: No acute fracture or subluxation. Glenohumeral joint is preserved. Minimal degenerative changes AC joint. Electronically Signed   By: Natasha Mead M.D.   On: 08/23/2015 10:16   I have personally reviewed and evaluated these images as part of my medical decision-making.   EKG Interpretation None      MDM   Final diagnoses:  Right shoulder pain   Patient presenting with right shoulder pain after injury yesterday. Right arm is neurovascularly intact with restricted ROM due to pain. Passive ROM intact. Patient X-Ray negative for obvious fracture or dislocation. Pain managed in ED with toradol. Brace given and conservative therapy recommended. Discussed RICE therapy and use of OTC pain relievers. Pt advised to follow up with orthopedics if symptoms persist. Return precautions discussed at bedside and given in discharge paperwork. Pt is stable for discharge.   I personally performed the services described in this documentation, which was scribed in my presence. The recorded information has been reviewed and is accurate.    Alveta Heimlich, PA-C 08/23/15 1044  Bethann Berkshire, MD 08/24/15 [phone]

## 2015-09-23 ENCOUNTER — Ambulatory Visit (INDEPENDENT_AMBULATORY_CARE_PROVIDER_SITE_OTHER): Payer: 59 | Admitting: Family Medicine

## 2015-09-23 ENCOUNTER — Encounter: Payer: Self-pay | Admitting: Family Medicine

## 2015-09-23 VITALS — BP 120/82 | HR 90 | Temp 97.7°F | Ht 68.0 in | Wt 213.8 lb

## 2015-09-23 DIAGNOSIS — J4521 Mild intermittent asthma with (acute) exacerbation: Secondary | ICD-10-CM

## 2015-09-23 DIAGNOSIS — J069 Acute upper respiratory infection, unspecified: Secondary | ICD-10-CM | POA: Diagnosis not present

## 2015-09-23 LAB — POCT INFLUENZA A/B
Influenza A, POC: NEGATIVE
Influenza B, POC: NEGATIVE

## 2015-09-23 MED ORDER — BENZONATATE 100 MG PO CAPS: 100.0000 mg | ORAL_CAPSULE | Freq: Three times a day (TID) | ORAL | Status: DC | PRN

## 2015-09-23 MED ORDER — PREDNISONE 20 MG PO TABS: 40.0000 mg | ORAL_TABLET | Freq: Every day | ORAL | Status: DC

## 2015-09-23 NOTE — Progress Notes (Signed)
Pre visit review using our clinic review tool, if applicable. No additional management support is needed unless otherwise documented below in the visit note. 

## 2015-09-23 NOTE — Patient Instructions (Signed)

## 2015-09-23 NOTE — Progress Notes (Signed)
HPI:  URI: -started: 2 days ago -symptoms:nasal congestion, sore throat, cough, L ear pressure, fevers 99-101 at night -denies: body aches, SOB, NVD, tooth pain, flu exposure -Hx of: allergies and ashtma and has used her albuterol a few times for feeling wheezy  ROS: See pertinent positives and negatives per HPI.  Past Medical History  Diagnosis Date  . Asthma   . Endometriosis     Past Surgical History  Procedure Laterality Date  . Nasal sinus surgery    . Cesarean section    . Cholecystectomy    . Wisdom tooth extraction    . Knee arthroscopy Left   . Laparoscopy      twice    Family History  Problem Relation Age of Onset  . Diabetes Mother   . Diabetes Maternal Grandfather   . Diabetes Paternal Grandfather   . Heart disease Maternal Grandfather   . Aneurysm Paternal Aunt     Social History   Social History  . Marital Status: Married    Spouse Name: N/A  . Number of Children: N/A  . Years of Education: N/A   Occupational History  . student    Social History Main Topics  . Smoking status: Never Smoker   . Smokeless tobacco: Never Used  . Alcohol Use: No  . Drug Use: No  . Sexual Activity:    Partners: Male   Other Topics Concern  . None   Social History Narrative   Work or School: homemaker      Home Situation: lives with husband and 3 yo son      Spiritual Beliefs: Christian      Lifestyle: starting to get regular exercise; eating healthy           Current outpatient prescriptions:  .  albuterol (PROVENTIL) (5 MG/ML) 0.5% nebulizer solution, Take 2.5 mg by nebulization every 6 (six) hours as needed for wheezing or shortness of breath., Disp: , Rfl:  .  budesonide-formoterol (SYMBICORT) 160-4.5 MCG/ACT inhaler, Inhale 2 puffs into the lungs 2 (two) times daily., Disp: , Rfl:  .  Prenatal Multivit-Min-Fe-FA (PRE-NATAL PO), Take 1 tablet by mouth daily., Disp: , Rfl:  .  benzonatate (TESSALON PERLES) 100 MG capsule, Take 1 capsule (100 mg  total) by mouth 3 (three) times daily as needed for cough., Disp: 20 capsule, Rfl: 0 .  predniSONE (DELTASONE) 20 MG tablet, Take 2 tablets (40 mg total) by mouth daily with breakfast., Disp: 8 tablet, Rfl: 0  EXAM:  Filed Vitals:   09/23/15 0952  BP: 120/82  Pulse: 90  Temp: 97.7 F (36.5 C)    Body mass index is 32.52 kg/(m^2).  GENERAL: vitals reviewed and listed above, alert, oriented, appears well hydrated and in no acute distress  HEENT: atraumatic, conjunttiva clear, no obvious abnormalities on inspection of external nose and ears, normal appearance of ear canals and TMs, clear nasal congestion, mild post oropharyngeal erythema with PND, no tonsillar edema or exudate, no sinus TTP  NECK: no obvious masses on inspection  LUNGS: clear to auscultation bilaterally, no wheezes, rales or rhonchi, good air movement  CV: HRRR, no peripheral edema  MS: moves all extremities without noticeable abnormality  PSYCH: pleasant and cooperative, no obvious depression or anxiety  ASSESSMENT AND PLAN:  Discussed the following assessment and plan:  Acute upper respiratory infection - Plan: POC Influenza A/B  Asthma, mild intermittent, with acute exacerbation  -given HPI and exam findings today, a serious infection or illness is unlikely. Rapid  flu was negative - we discussed possibility of false negative. We discussed potential etiologies, with VURI being most likely, and advised prednisone for the asthma symptoms along with supportive care and monitoring. We discussed treatment side effects, likely course, antibiotic misuse, transmission, and signs of developing a serious illness. -of course, we advised to return or notify a doctor immediately if symptoms worsen or persist or new concerns arise.    Patient Instructions  INSTRUCTIONS FOR UPPER RESPIRATORY INFECTION:  -plenty of rest and fluids  -nasal saline wash 2-3 times daily (use prepackaged nasal saline or bottled/distilled  water if making your own)   -can use AFRIN nasal spray for drainage and nasal congestion - but do NOT use longer then 3-4 days  -can use tylenol (in no history of liver disease) or ibuprofen (if no history of kidney disease, bowel bleeding or significant heart disease) as directed for aches and sorethroat  -in the winter time, using a humidifier at night is helpful (please follow cleaning instructions)  -if you are taking a cough medication - use only as directed, may also try a teaspoon of honey to coat the throat and throat lozenges. If given a cough medication with codeine or hydrocodone or other narcotic please be advised that this contains a strong and  potentially addicting medication. Please follow instructions carefully, take as little as possible and only use AS NEEDED for severe cough. Discuss potential side effects with your pharmacy. Please do not drive or operate machinery while taking these types of medications. Please do not take other sedating medications, drugs or alcohol while taking this medication without discussing with your doctor.  -for sore throat, salt water gargles can help  -follow up if you have fevers, facial pain, tooth pain, difficulty breathing or are worsening or symptoms persist longer then expected  Upper Respiratory Infection, Adult An upper respiratory infection (URI) is also known as the common cold. It is often caused by a type of germ (virus). Colds are easily spread (contagious). You can pass it to others by kissing, coughing, sneezing, or drinking out of the same glass. Usually, you get better in 1 to 3  weeks.  However, the cough can last for even longer. HOME CARE   Only take medicine as told by your doctor. Follow instructions provided above.  Drink enough water and fluids to keep your pee (urine) clear or pale yellow.  Get plenty of rest.  Return to work when your temperature is < 100 for 24 hours or as told by your doctor. You may use a face mask  and wash your hands to stop your cold from spreading. GET HELP RIGHT AWAY IF:   After the first few days, you feel you are getting worse.  You have questions about your medicine.  You have chills, shortness of breath, or red spit (mucus).  You have pain in the face for more then 1-2 days, especially when you bend forward.  You have a fever, puffy (swollen) neck, pain when you swallow, or white spots in the back of your throat.  You have a bad headache, ear pain, sinus pain, or chest pain.  You have a high-pitched whistling sound when you breathe in and out (wheezing).  You cough up blood.  You have sore muscles or a stiff neck. MAKE SURE YOU:   Understand these instructions.  Will watch your condition.  Will get help right away if you are not doing well or get worse. Document Released: 01/13/2008 Document Revised: 10/19/2011  Document Reviewed: 11/01/2013 [facility] Patient Information 2015 [place], [place]. This information is not intended to replace advice given to you by your health care provider. Make sure you discuss any questions you have with your health care provider.      Kriste Basque R.

## 2015-09-27 ENCOUNTER — Ambulatory Visit (INDEPENDENT_AMBULATORY_CARE_PROVIDER_SITE_OTHER): Payer: 59 | Admitting: Adult Health

## 2015-09-27 ENCOUNTER — Encounter: Payer: Self-pay | Admitting: Adult Health

## 2015-09-27 VITALS — BP 104/70 | Temp 98.8°F | Ht 68.0 in | Wt 216.0 lb

## 2015-09-27 DIAGNOSIS — J0100 Acute maxillary sinusitis, unspecified: Secondary | ICD-10-CM

## 2015-09-27 DIAGNOSIS — J029 Acute pharyngitis, unspecified: Secondary | ICD-10-CM | POA: Diagnosis not present

## 2015-09-27 LAB — POCT RAPID STREP A (OFFICE): Rapid Strep A Screen: NEGATIVE

## 2015-09-27 MED ORDER — MAGIC MOUTHWASH W/LIDOCAINE: 5.0000 mL | Freq: Three times a day (TID) | ORAL | Status: DC | PRN

## 2015-09-27 MED ORDER — DOXYCYCLINE HYCLATE 100 MG PO CAPS: 100.0000 mg | ORAL_CAPSULE | Freq: Two times a day (BID) | ORAL | Status: DC

## 2015-09-27 NOTE — Progress Notes (Signed)
Subjective:    Patient ID: Diane Lester, female    DOB: [birth year], 35 y.o.   MRN: [id number]  HPI  35 year old female who presents to the office today for URI type symptoms. She saw Dr. Selena Batten four days ago with the same symptoms. She was diagnosed with a viral URI at this time. Today she reports that her symptoms have been getting worse and her throat pain has increased " 10 fold".  She is having fevers up to 101 at home with nasal congestions, sore throat, cough and left ear pressure.   Flu swab 4 days ago was negative  Review of Systems  Constitutional: Positive for fever, chills, diaphoresis and activity change.  HENT: Positive for congestion, postnasal drip, rhinorrhea, sinus pressure, sneezing and sore throat. Negative for ear discharge and ear pain.   Respiratory: Positive for cough. Negative for shortness of breath and wheezing.   Cardiovascular: Negative.   Neurological: Positive for headaches.   Past Medical History  Diagnosis Date  . Asthma   . Endometriosis     Social History   Social History  . Marital Status: Married    Spouse Name: N/A  . Number of Children: N/A  . Years of Education: N/A   Occupational History  . student    Social History Main Topics  . Smoking status: Never Smoker   . Smokeless tobacco: Never Used  . Alcohol Use: No  . Drug Use: No  . Sexual Activity:    Partners: Male   Other Topics Concern  . Not on file   Social History Narrative   Work or School: homemaker      Home Situation: lives with husband and 3 yo son      Spiritual Beliefs: Christian      Lifestyle: starting to get regular exercise; eating healthy          Past Surgical History  Procedure Laterality Date  . Nasal sinus surgery    . Cesarean section    . Cholecystectomy    . Wisdom tooth extraction    . Knee arthroscopy Left   . Laparoscopy      twice    Family History  Problem Relation Age of Onset  . Diabetes Mother   . Diabetes Maternal  Grandfather   . Diabetes Paternal Grandfather   . Heart disease Maternal Grandfather   . Aneurysm Paternal Aunt     Allergies  Allergen Reactions  . Augmentin [Amoxicillin-Pot Clavulanate] Nausea And Vomiting  . Biaxin [Clarithromycin] Nausea And Vomiting  . Keflex [Cephalexin] Nausea And Vomiting    Current Outpatient Prescriptions on File Prior to Visit  Medication Sig Dispense Refill  . albuterol (PROVENTIL) (5 MG/ML) 0.5% nebulizer solution Take 2.5 mg by nebulization every 6 (six) hours as needed for wheezing or shortness of breath.    . benzonatate (TESSALON PERLES) 100 MG capsule Take 1 capsule (100 mg total) by mouth 3 (three) times daily as needed for cough. 20 capsule 0  . budesonide-formoterol (SYMBICORT) 160-4.5 MCG/ACT inhaler Inhale 2 puffs into the lungs 2 (two) times daily.    . predniSONE (DELTASONE) 20 MG tablet Take 2 tablets (40 mg total) by mouth daily with breakfast. 8 tablet 0  . Prenatal Multivit-Min-Fe-FA (PRE-NATAL PO) Take 1 tablet by mouth daily.     No current facility-administered medications on file prior to visit.    BP 104/70 mmHg  Temp(Src) 98.8 F (37.1 C) (Oral)  Ht  (1.727 m)  Wt 216  lb (97.977 kg)  BMI 32.85 kg/m2  LMP 08/29/2015       Objective:   Physical Exam  Constitutional: She is oriented to person, place, and time. She appears well-developed and well-nourished. No distress.  HENT:  Head: Normocephalic and atraumatic.  Right Ear: External ear normal.  Left Ear: External ear normal.  Nose: Nose normal.  Mouth/Throat: Oropharyngeal exudate present.  severly swollen tonsils. Trace exudate Trace fluid behind left TM - no signs of infection  Neck: Normal range of motion. Neck supple. No thyromegaly present.  Cardiovascular: Normal rate, regular rhythm, normal heart sounds and intact distal pulses.  Exam reveals no gallop and no friction rub.   No murmur heard. Pulmonary/Chest: Effort normal and breath sounds normal. No  respiratory distress. She has no wheezes. She has no rales. She exhibits no tenderness.  Lymphadenopathy:       Head (right side): Tonsillar and preauricular adenopathy present. No submental and no posterior auricular adenopathy present.       Head (left side): Tonsillar and preauricular adenopathy present. No submental and no posterior auricular adenopathy present.    She has cervical adenopathy.       Right cervical: Superficial cervical, deep cervical and posterior cervical adenopathy present.       Left cervical: Superficial cervical, deep cervical and posterior cervical adenopathy present.  Neurological: She is alert and oriented to person, place, and time.  Skin: Skin is warm and dry. No rash noted. She is not diaphoretic. No erythema. No pallor.  Psychiatric: She has a normal mood and affect. Her behavior is normal. Judgment and thought content normal.  Nursing note and vitals reviewed.      Assessment & Plan:   1. Sore throat - POC Rapid Strep A - magic mouthwash w/lidocaine SOLN; Take 5 mLs by mouth 3 (three) times daily as needed for mouth pain.  Dispense: 180 mL; Refill: 0 - Culture, Group A Strep - Warm saltwater gargles - Will follow up with on culture - likely viral.  2. Acute maxillary sinusitis, recurrence not specified - She looks exhausted and her symptoms are getting worse. I will treat her sinus infection at this time.  - doxycycline (VIBRAMYCIN) 100 MG capsule; Take 1 capsule (100 mg total) by mouth 2 (two) times daily.  Dispense: 20 capsule; Refill: 0

## 2015-09-27 NOTE — Patient Instructions (Signed)
I am sorry you are feeling so badly.   Continue to use Prednisone and Mucinex.   I have sent in a prescription for doxycycline and Magic mouthwash with lidocaine. Take the doxy twice a day for 10 days. Gargle with the magic mouth wash for 15 seconds and spit  You can also use warm salt water gargles.   We will follow up with you regarding the culture.

## 2015-09-27 NOTE — Progress Notes (Signed)
Pre visit review using our clinic review tool, if applicable. No additional management support is needed unless otherwise documented below in the visit note. 

## 2015-09-29 LAB — CULTURE, GROUP A STREP: Organism ID, Bacteria: NORMAL

## 2016-03-21 ENCOUNTER — Encounter (HOSPITAL_COMMUNITY): Payer: Self-pay | Admitting: *Deleted

## 2016-03-21 ENCOUNTER — Inpatient Hospital Stay (HOSPITAL_COMMUNITY)
Admission: AD | Admit: 2016-03-21 | Discharge: 2016-03-21 | Disposition: A | Discharge status: Home / Self Care | Payer: 59 | Source: Ambulatory Visit | Attending: Obstetrics & Gynecology | Admitting: Obstetrics & Gynecology

## 2016-03-21 DIAGNOSIS — Z880 Allergy status to penicillin: Secondary | ICD-10-CM | POA: Insufficient documentation

## 2016-03-21 DIAGNOSIS — Z3A22 22 weeks gestation of pregnancy: Secondary | ICD-10-CM | POA: Diagnosis present

## 2016-03-21 DIAGNOSIS — R350 Frequency of micturition: Secondary | ICD-10-CM

## 2016-03-21 DIAGNOSIS — Z888 Allergy status to other drugs, medicaments and biological substances status: Secondary | ICD-10-CM | POA: Insufficient documentation

## 2016-03-21 DIAGNOSIS — Z9889 Other specified postprocedural states: Secondary | ICD-10-CM | POA: Diagnosis not present

## 2016-03-21 DIAGNOSIS — O99512 Diseases of the respiratory system complicating pregnancy, second trimester: Secondary | ICD-10-CM | POA: Diagnosis not present

## 2016-03-21 DIAGNOSIS — R300 Dysuria: Secondary | ICD-10-CM | POA: Insufficient documentation

## 2016-03-21 LAB — URINALYSIS, ROUTINE W REFLEX MICROSCOPIC
BILIRUBIN URINE: NEGATIVE
Glucose, UA: NEGATIVE mg/dL
HGB URINE DIPSTICK: NEGATIVE
KETONES UR: NEGATIVE mg/dL
Leukocytes, UA: NEGATIVE
NITRITE: NEGATIVE
PH: 6 (ref 5.0–8.0)
Protein, ur: NEGATIVE mg/dL
Specific Gravity, Urine: 1.02 (ref 1.005–1.030)

## 2016-03-21 LAB — POCT PREGNANCY, URINE: Preg Test, Ur: POSITIVE — AB

## 2016-03-21 NOTE — MAU Note (Signed)
Having an [place] amt of urination.  Some pain with urination.  Having a little back pain.  Was in office yesterday, spec and culture collected- has not heard anything . Symptoms getting worse. Denies fever.

## 2016-03-21 NOTE — MAU Provider Note (Signed)
History   G2P0101 @ 22.2 wks in with burning, pain, frequency, that started earlier in the week but is worse today. Denies contractions, ROM, or vag bleeding.  CSN: [id number]  Arrival date & time 03/21/16  1620   None     Chief Complaint  Patient presents with  . Dysuria    HPI  Past Medical History:  Diagnosis Date  . Asthma   . Endometriosis     Past Surgical History:  Procedure Laterality Date  . CESAREAN SECTION    . CHOLECYSTECTOMY    . KNEE ARTHROSCOPY Left   . LAPAROSCOPY     twice  . NASAL SINUS SURGERY    . WISDOM TOOTH EXTRACTION      Family History  Problem Relation Age of Onset  . Diabetes Mother   . Diabetes Maternal Grandfather   . Heart disease Maternal Grandfather   . Diabetes Paternal Grandfather   . Aneurysm Paternal Aunt     Social History  Substance Use Topics  . Smoking status: Never Smoker  . Smokeless tobacco: Never Used  . Alcohol use No    OB History    Gravida Para Term Preterm AB Living   2 1   1   1    SAB TAB Ectopic Multiple Live Births                  Review of Systems  Constitutional: Negative.   HENT: Negative.   Eyes: Negative.   Respiratory: Negative.   Cardiovascular: Negative.   Gastrointestinal: Negative.   Genitourinary: Positive for dysuria, frequency and urgency.  Musculoskeletal: Negative.   Skin: Negative.   Allergic/Immunologic: Negative.   Neurological: Negative.   Hematological: Negative.   Psychiatric/Behavioral: Negative.     Allergies  Augmentin [amoxicillin-pot clavulanate]; Biaxin [clarithromycin]; and Keflex [cephalexin]  Home Medications    BP 145/84 (BP Location: Right Arm)   Pulse 104   Temp 97.9 F (36.6 C) (Oral)   Resp 18   Wt 239 lb 9.6 oz (108.7 kg)   LMP 08/29/2015   BMI 36.43 kg/m   Physical Exam  Constitutional: She is oriented to person, place, and time. She appears well-developed and well-nourished.  HENT:  Head: Normocephalic.  Eyes: Pupils are equal, round,  and reactive to light.  Neck: Normal range of motion.  Cardiovascular: Normal rate, regular rhythm, normal heart sounds and intact distal pulses.   Pulmonary/Chest: Effort normal and breath sounds normal.  Abdominal: Soft. Bowel sounds are normal.  Genitourinary: Vagina normal and uterus normal.  Musculoskeletal: Normal range of motion.  Neurological: She is alert and oriented to person, place, and time. She has normal reflexes.  Skin: Skin is warm and dry.  Psychiatric: She has a normal mood and affect. Her behavior is normal. Judgment and thought content normal.    MAU Course  Procedures (including critical care time)  Labs Reviewed  URINALYSIS, ROUTINE W REFLEX MICROSCOPIC (NOT [facility])   No results found.   No diagnosis found.    MDM  FHR st and reg with doppler. U/a clear, SVE deferred  At pt request. Pt states she had exam yesterday and was closed and 4 cm long. Will d/c home

## 2016-03-21 NOTE — Discharge Instructions (Signed)
Urinary Frequency °The number of times a normal person urinates depends upon how much liquid they take in and how much liquid they are losing. If the temperature is hot and there is high humidity, then the person will sweat more and usually breathe a little more frequently. These factors decrease the amount of frequency of urination that would be considered normal. °The amount you drink is easily determined, but the amount of fluid lost is sometimes more difficult to calculate.  °Fluid is lost in two ways: °· Sensible fluid loss is usually measured by the amount of urine that you get rid of. Losses of fluid can also occur with diarrhea. °· Insensible fluid loss is more difficult to measure. It is caused by evaporation. Insensible loss of fluid occurs through breathing and sweating. It usually ranges from a little less than a quart to a little more than a quart of fluid a day. °In normal temperatures and activity levels, the average person may urinate 4 to 7 times in a 24-hour period. Needing to urinate more often than that could indicate a problem. If one urinates 4 to 7 times in 24 hours and has large volumes each time, that could indicate a different problem from one who urinates 4 to 7 times a day and has small volumes. The time of urinating is also important. Most urinating should be done during the waking hours. Getting up at night to urinate frequently can indicate some problems. °CAUSES  °The bladder is the organ in your lower abdomen that holds urine. Like a balloon, it swells some as it fills up. Your nerves sense this and tell you it is time to head for the bathroom. There are a number of reasons that you might feel the need to urinate more often than usual. They include: °· Urinary tract infection. This is usually associated with other signs such as burning when you urinate. °· In men, problems with the prostate (a walnut-size gland that is located near the tube that carries urine out of your body). There  are two reasons why the prostate can cause an increased frequency of urination: °¨ An enlarged prostate that does not let the bladder empty well. If the bladder only half empties when you urinate, then it only has half the capacity to fill before you have to urinate again. °¨ The nerves in the bladder become more hypersensitive with an increased size of the prostate even if the bladder empties completely. °· Pregnancy. °· Obesity. Excess weight is more likely to cause a problem for women than for men. °· Bladder stones or other bladder problems. °· Caffeine. °· Alcohol. °· Medications. For example, drugs that help the body get rid of extra fluid (diuretics) increase urine production. Some other medicines must be taken with lots of fluids. °· Muscle or nerve weakness. This might be the result of a spinal cord injury, a stroke, multiple sclerosis, or Parkinson disease. °· Long-standing diabetes can decrease the sensation of the bladder. This loss of sensation makes it harder to sense the bladder needs to be emptied. Over a period of years, the bladder is stretched out by constant overfilling. This weakens the bladder muscles so that the bladder does not empty well and has less capacity to fill with new urine. °· Interstitial cystitis (also called painful bladder syndrome). This condition develops because the tissues that line the inside of the bladder are inflamed (inflammation is the body's way of reacting to injury or infection). It causes pain and frequent   urination. It occurs in women more often than in men. °DIAGNOSIS  °· To decide what might be causing your urinary frequency, your health care provider will probably: °¨ Ask about symptoms you have noticed. °¨ Ask about your overall health. This will include questions about any medications you are taking. °¨ Do a physical examination. °· Order some tests. These might include: °¨ A blood test to check for diabetes or other health issues that could be contributing  to the problem. °¨ Urine testing. This could measure the flow of urine and the pressure on the bladder. °¨ A test of your neurological system (the brain, spinal cord, and nerves). This is the system that senses the need to urinate. °¨ A bladder test to check whether it is emptying completely when you urinate. °¨ Cystoscopy. This test uses a thin tube with a tiny camera on it. It offers a look inside your urethra and bladder to see if there are problems. °¨ Imaging tests. You might be given a contrast dye and then asked to urinate. X-rays are taken to see how your bladder is working. °TREATMENT  °It is important for you to be evaluated to determine if the amount or frequency that you have is unusual or abnormal. If it is found to be abnormal, the cause should be determined and this can usually be found out easily. Depending upon the cause, treatment could include medication, stimulation of the nerves, or surgery. °There are not too many things that you can do as an individual to change your urinary frequency. It is important that you balance the amount of fluid intake needed to compensate for your activity and the temperature. Medical problems will be diagnosed and taken care of by your physician. There is no particular bladder training such as Kegel exercises that you can do to help urinary frequency. This is an exercise that is usually recommended for people who have leaking of urine when they laugh, cough, or sneeze. °HOME CARE INSTRUCTIONS  °· Take any medications your health care provider prescribed or suggested. Follow the directions carefully. °· Practice any lifestyle changes that are recommended. These might include: °¨ Drinking less fluid or drinking at different times of the day. If you need to urinate often during the night, for example, you may need to stop drinking fluids early in the evening. °¨ Cutting down on caffeine or alcohol. They both can make you need to urinate more often than normal. Caffeine  is found in coffee, tea, and sodas. °¨ Losing weight, if that is recommended. °· Keep a journal or a log. You might be asked to record how much you drink and when and where you feel the need to urinate. This will also help evaluate how well the treatment provided by your physician is working. °SEEK MEDICAL CARE IF:  °· Your need to urinate often gets worse. °· You feel increased pain or irritation when you urinate. °· You notice blood in your urine. °· You have questions about any medications that your health care provider recommended. °· You notice blood, pus, or swelling at the site of any test or treatment procedure. °· You develop a fever of more than 100.5°F (38.1°C). °SEEK IMMEDIATE MEDICAL CARE IF:  °You develop a fever of more than 102.0°F (38.9°C). °  °This information is not intended to replace advice given to you by your health care provider. Make sure you discuss any questions you have with your health care provider. °  °Document Released: 05/23/2009 Document Revised:   08/17/2014 Document Reviewed: 05/23/2009 °Elsevier Interactive Patient Education ©2016 [employer] ° °
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
## 2016-05-27 ENCOUNTER — Other Ambulatory Visit: Payer: Self-pay | Admitting: Obstetrics

## 2016-06-29 ENCOUNTER — Encounter (HOSPITAL_COMMUNITY)
Admission: AD | Disposition: A | Discharge status: Home / Self Care | Payer: Self-pay | Source: Ambulatory Visit | Attending: Obstetrics & Gynecology

## 2016-06-29 ENCOUNTER — Encounter (HOSPITAL_COMMUNITY): Payer: Self-pay | Admitting: Anesthesiology

## 2016-06-29 ENCOUNTER — Inpatient Hospital Stay (HOSPITAL_COMMUNITY)
Admission: AD | Admit: 2016-06-29 | Discharge: 2016-07-02 | DRG: 765 | Disposition: A | Discharge status: Home / Self Care | Payer: 59 | Source: Ambulatory Visit | Attending: Obstetrics & Gynecology | Admitting: Obstetrics & Gynecology

## 2016-06-29 ENCOUNTER — Inpatient Hospital Stay (HOSPITAL_COMMUNITY): Payer: 59 | Admitting: Anesthesiology

## 2016-06-29 ENCOUNTER — Encounter (HOSPITAL_COMMUNITY): Payer: Self-pay

## 2016-06-29 DIAGNOSIS — D62 Acute posthemorrhagic anemia: Secondary | ICD-10-CM | POA: Diagnosis not present

## 2016-06-29 DIAGNOSIS — Z833 Family history of diabetes mellitus: Secondary | ICD-10-CM | POA: Diagnosis not present

## 2016-06-29 DIAGNOSIS — O34211 Maternal care for low transverse scar from previous cesarean delivery: Secondary | ICD-10-CM | POA: Diagnosis present

## 2016-06-29 DIAGNOSIS — Z8249 Family history of ischemic heart disease and other diseases of the circulatory system: Secondary | ICD-10-CM | POA: Diagnosis not present

## 2016-06-29 DIAGNOSIS — D509 Iron deficiency anemia, unspecified: Secondary | ICD-10-CM | POA: Diagnosis present

## 2016-06-29 DIAGNOSIS — O42013 Preterm premature rupture of membranes, onset of labor within 24 hours of rupture, third trimester: Secondary | ICD-10-CM | POA: Diagnosis present

## 2016-06-29 DIAGNOSIS — Z3A36 36 weeks gestation of pregnancy: Secondary | ICD-10-CM

## 2016-06-29 DIAGNOSIS — Z6841 Body Mass Index (BMI) 40.0 and over, adult: Secondary | ICD-10-CM | POA: Diagnosis not present

## 2016-06-29 DIAGNOSIS — Z7982 Long term (current) use of aspirin: Secondary | ICD-10-CM | POA: Diagnosis not present

## 2016-06-29 DIAGNOSIS — O99214 Obesity complicating childbirth: Secondary | ICD-10-CM | POA: Diagnosis present

## 2016-06-29 DIAGNOSIS — O99824 Streptococcus B carrier state complicating childbirth: Secondary | ICD-10-CM | POA: Diagnosis present

## 2016-06-29 DIAGNOSIS — O134 Gestational [pregnancy-induced] hypertension without significant proteinuria, complicating childbirth: Secondary | ICD-10-CM | POA: Diagnosis present

## 2016-06-29 DIAGNOSIS — O99019 Anemia complicating pregnancy, unspecified trimester: Secondary | ICD-10-CM

## 2016-06-29 DIAGNOSIS — O133 Gestational [pregnancy-induced] hypertension without significant proteinuria, third trimester: Secondary | ICD-10-CM

## 2016-06-29 DIAGNOSIS — O9081 Anemia of the puerperium: Secondary | ICD-10-CM | POA: Diagnosis not present

## 2016-06-29 DIAGNOSIS — O4290 Premature rupture of membranes, unspecified as to length of time between rupture and onset of labor, unspecified weeks of gestation: Secondary | ICD-10-CM

## 2016-06-29 HISTORY — DX: Gestational (pregnancy-induced) hypertension without significant proteinuria, third trimester: O13.3

## 2016-06-29 HISTORY — DX: Female infertility, unspecified: N97.9

## 2016-06-29 HISTORY — DX: Anemia complicating pregnancy, unspecified trimester: O99.019

## 2016-06-29 HISTORY — DX: Premature rupture of membranes, unspecified as to length of time between rupture and onset of labor, unspecified weeks of gestation: O42.90

## 2016-06-29 HISTORY — DX: Acute posthemorrhagic anemia: D62

## 2016-06-29 HISTORY — DX: Iron deficiency anemia, unspecified: D50.9

## 2016-06-29 LAB — CBC
HCT: 29.8 % — ABNORMAL LOW (ref 36.0–46.0)
HEMOGLOBIN: 10.1 g/dL — AB (ref 12.0–15.0)
MCH: 30.1 pg (ref 26.0–34.0)
MCHC: 33.9 g/dL (ref 30.0–36.0)
MCV: 88.7 fL (ref 78.0–100.0)
Platelets: 228 10*3/uL (ref 150–400)
RBC: 3.36 MIL/uL — AB (ref 3.87–5.11)
RDW: 14.1 % (ref 11.5–15.5)
WBC: 8.8 10*3/uL (ref 4.0–10.5)

## 2016-06-29 LAB — COMPREHENSIVE METABOLIC PANEL
ALBUMIN: 2.9 g/dL — AB (ref 3.5–5.0)
ALK PHOS: 101 U/L (ref 38–126)
ALT: 18 U/L (ref 14–54)
AST: 27 U/L (ref 15–41)
Anion gap: 8 (ref 5–15)
BILIRUBIN TOTAL: 0.4 mg/dL (ref 0.3–1.2)
BUN: 7 mg/dL (ref 6–20)
CO2: 19 mmol/L — ABNORMAL LOW (ref 22–32)
Calcium: 8.9 mg/dL (ref 8.9–10.3)
Chloride: 107 mmol/L (ref 101–111)
Creatinine, Ser: 0.54 mg/dL (ref 0.44–1.00)
Glucose, Bld: 87 mg/dL (ref 65–99)
POTASSIUM: 3.7 mmol/L (ref 3.5–5.1)
SODIUM: 134 mmol/L — AB (ref 135–145)
TOTAL PROTEIN: 6.9 g/dL (ref 6.5–8.1)

## 2016-06-29 LAB — TYPE AND SCREEN
ABO/RH(D): A POS
ANTIBODY SCREEN: NEGATIVE

## 2016-06-29 LAB — POCT FERN TEST: POCT Fern Test: POSITIVE

## 2016-06-29 LAB — ABO/RH: ABO/RH(D): A POS

## 2016-06-29 LAB — RPR: RPR Ser Ql: NONREACTIVE

## 2016-06-29 LAB — URIC ACID: URIC ACID, SERUM: 3.8 mg/dL (ref 2.3–6.6)

## 2016-06-29 SURGERY — Surgical Case
Anesthesia: Spinal

## 2016-06-29 MED ORDER — MEPERIDINE HCL 25 MG/ML IJ SOLN: 6.2500 mg | INTRAMUSCULAR | Status: DC | PRN

## 2016-06-29 MED ORDER — MIDAZOLAM HCL 2 MG/2ML IJ SOLN: 0.5000 mg | Freq: Once | INTRAMUSCULAR | Status: DC

## 2016-06-29 MED ORDER — NALBUPHINE HCL 10 MG/ML IJ SOLN: 5.0000 mg | Freq: Once | INTRAMUSCULAR | Status: DC | PRN

## 2016-06-29 MED ORDER — SOD CITRATE-CITRIC ACID 500-334 MG/5ML PO SOLN
30.0000 mL | Freq: Once | ORAL | Status: AC
  Administered 2016-06-29: 30 mL via ORAL
  Filled 2016-06-29: qty 15

## 2016-06-29 MED ORDER — SCOPOLAMINE 1 MG/3DAYS TD PT72
MEDICATED_PATCH | TRANSDERMAL | Status: AC
  Filled 2016-06-29: qty 1

## 2016-06-29 MED ORDER — SCOPOLAMINE 1 MG/3DAYS TD PT72SCOPOLAMINE 1 MG/3DAYS
MEDICATED_PATCH | TRANSDERMAL | Status: DC | PRN
Start: 2016-06-29 — End: 2016-06-29
  Administered 2016-06-29: 1 via TRANSDERMAL

## 2016-06-29 MED ORDER — FENTANYL CITRATE (PF) 100 MCG/2ML IJ SOLN
INTRAMUSCULAR | Status: AC
  Filled 2016-06-29: qty 2

## 2016-06-29 MED ORDER — SIMETHICONE 80 MG PO CHEW
80.0000 mg | CHEWABLE_TABLET | Freq: Three times a day (TID) | ORAL | Status: DC
  Administered 2016-06-29 – 2016-07-02 (×8): 80 mg via ORAL
  Filled 2016-06-29 (×9): qty 1

## 2016-06-29 MED ORDER — OXYCODONE-ACETAMINOPHEN 5-325 MG PO TABS: 1.0000 | ORAL_TABLET | ORAL | Status: DC | PRN

## 2016-06-29 MED ORDER — ONDANSETRON HCL 4 MG/2ML IJ SOLN
INTRAMUSCULAR | Status: AC
  Filled 2016-06-29: qty 2

## 2016-06-29 MED ORDER — NALBUPHINE HCL 10 MG/ML IJ SOLN: 5.0000 mg | INTRAMUSCULAR | Status: DC | PRN

## 2016-06-29 MED ORDER — KETOROLAC TROMETHAMINE 30 MG/ML IJ SOLN
INTRAMUSCULAR | Status: AC
  Filled 2016-06-29: qty 1

## 2016-06-29 MED ORDER — NALOXONE HCL 2 MG/2ML IJ SOSY
1.0000 ug/kg/h | PREFILLED_SYRINGE | INTRAMUSCULAR | Status: DC | PRN
  Filled 2016-06-29: qty 2

## 2016-06-29 MED ORDER — DIPHENHYDRAMINE HCL 25 MG PO CAPS: 25.0000 mg | ORAL_CAPSULE | ORAL | Status: DC | PRN

## 2016-06-29 MED ORDER — KETOROLAC TROMETHAMINE 30 MG/ML IJ SOLN
30.0000 mg | Freq: Four times a day (QID) | INTRAMUSCULAR | Status: DC | PRN
  Administered 2016-06-29: 30 mg via INTRAMUSCULAR

## 2016-06-29 MED ORDER — SIMETHICONE 80 MG PO CHEW
80.0000 mg | CHEWABLE_TABLET | ORAL | Status: DC
  Administered 2016-06-30 – 2016-07-02 (×3): 80 mg via ORAL
  Filled 2016-06-29 (×3): qty 1

## 2016-06-29 MED ORDER — ACETAMINOPHEN 500 MG PO TABS
1000.0000 mg | ORAL_TABLET | Freq: Four times a day (QID) | ORAL | Status: AC
  Administered 2016-06-29 – 2016-06-30 (×3): 1000 mg via ORAL
  Filled 2016-06-29 (×3): qty 2

## 2016-06-29 MED ORDER — NALOXONE HCL 0.4 MG/ML IJ SOLN: 0.4000 mg | INTRAMUSCULAR | Status: DC | PRN

## 2016-06-29 MED ORDER — ZOLPIDEM TARTRATE 5 MG PO TABS: 5.0000 mg | ORAL_TABLET | Freq: Every evening | ORAL | Status: DC | PRN

## 2016-06-29 MED ORDER — SCOPOLAMINE 1 MG/3DAYS TD PT72
1.0000 | MEDICATED_PATCH | Freq: Once | TRANSDERMAL | Status: DC
  Filled 2016-06-29: qty 1

## 2016-06-29 MED ORDER — KETOROLAC TROMETHAMINE 30 MG/ML IJ SOLN: 30.0000 mg | Freq: Once | INTRAMUSCULAR | Status: DC

## 2016-06-29 MED ORDER — PENICILLIN G POTASSIUM 5000000 UNITS IJ SOLR
5.0000 10*6.[IU] | Freq: Once | INTRAVENOUS | Status: AC
  Administered 2016-06-29: 5 10*6.[IU] via INTRAVENOUS
  Filled 2016-06-29: qty 5

## 2016-06-29 MED ORDER — BETAMETHASONE SOD PHOS & ACET 6 (3-3) MG/ML IJ SUSP
12.0000 mg | Freq: Once | INTRAMUSCULAR | Status: AC
  Administered 2016-06-29: 12 mg via INTRAMUSCULAR
  Filled 2016-06-29: qty 2

## 2016-06-29 MED ORDER — DIPHENHYDRAMINE HCL 50 MG/ML IJ SOLN: 12.5000 mg | INTRAMUSCULAR | Status: DC | PRN

## 2016-06-29 MED ORDER — HYDROMORPHONE HCL 1 MG/ML IJ SOLN: 0.2500 mg | INTRAMUSCULAR | Status: DC | PRN

## 2016-06-29 MED ORDER — CEFAZOLIN SODIUM-DEXTROSE 2-3 GM-% IV SOLR
INTRAVENOUS | Status: DC | PRN
  Administered 2016-06-29: 2 g via INTRAVENOUS

## 2016-06-29 MED ORDER — LACTATED RINGERS IV SOLN
INTRAVENOUS | Status: DC
Start: 2016-06-29 — End: 2016-06-29
  Administered 2016-06-29 (×2): via INTRAVENOUS

## 2016-06-29 MED ORDER — PENICILLIN G POT IN DEXTROSE 60000 UNIT/ML IV SOLN
3.0000 10*6.[IU] | INTRAVENOUS | Status: DC
  Filled 2016-06-29: qty 50

## 2016-06-29 MED ORDER — PHENYLEPHRINE HCL 10 MG/ML IJ SOLN
INTRAVENOUS | Status: DC | PRN
  Administered 2016-06-29: 60 ug/min via INTRAVENOUS

## 2016-06-29 MED ORDER — IBUPROFEN 600 MG PO TABS
600.0000 mg | ORAL_TABLET | Freq: Four times a day (QID) | ORAL | Status: DC
  Administered 2016-06-29 – 2016-07-02 (×12): 600 mg via ORAL
  Filled 2016-06-29 (×12): qty 1

## 2016-06-29 MED ORDER — PHENYLEPHRINE 8 MG IN D5W 100 ML (0.08MG/ML) PREMIX OPTIME
INJECTION | INTRAVENOUS | Status: AC
  Filled 2016-06-29: qty 100

## 2016-06-29 MED ORDER — FAMOTIDINE IN NACL 20-0.9 MG/50ML-% IV SOLN
20.0000 mg | Freq: Once | INTRAVENOUS | Status: AC
  Administered 2016-06-29: 20 mg via INTRAVENOUS
  Filled 2016-06-29: qty 50

## 2016-06-29 MED ORDER — DIBUCAINE 1 % RE OINT: 1.0000 "application " | TOPICAL_OINTMENT | RECTAL | Status: DC | PRN

## 2016-06-29 MED ORDER — PROMETHAZINE HCL 25 MG/ML IJ SOLN: 6.2500 mg | INTRAMUSCULAR | Status: DC | PRN

## 2016-06-29 MED ORDER — OXYTOCIN 40 UNITS IN LACTATED RINGERS INFUSION - SIMPLE MED: 2.5000 [IU]/h | INTRAVENOUS | Status: AC

## 2016-06-29 MED ORDER — DEXAMETHASONE SODIUM PHOSPHATE 10 MG/ML IJ SOLN
INTRAMUSCULAR | Status: AC
  Filled 2016-06-29: qty 1

## 2016-06-29 MED ORDER — DEXAMETHASONE SODIUM PHOSPHATE 10 MG/ML IJ SOLN
INTRAMUSCULAR | Status: DC | PRN
  Administered 2016-06-29: 10 mg via INTRAVENOUS

## 2016-06-29 MED ORDER — SIMETHICONE 80 MG PO CHEW
80.0000 mg | CHEWABLE_TABLET | ORAL | Status: DC | PRN
  Administered 2016-07-02: 80 mg via ORAL
  Filled 2016-06-29: qty 1

## 2016-06-29 MED ORDER — FENTANYL CITRATE (PF) 100 MCG/2ML IJ SOLN
INTRAMUSCULAR | Status: DC | PRN
  Administered 2016-06-29: 20 ug via INTRATHECAL

## 2016-06-29 MED ORDER — TETANUS-DIPHTH-ACELL PERTUSSIS 5-2.5-18.5 LF-MCG/0.5 IM SUSP: 0.5000 mL | Freq: Once | INTRAMUSCULAR | Status: DC

## 2016-06-29 MED ORDER — CEFAZOLIN SODIUM-DEXTROSE 2-4 GM/100ML-% IV SOLN
2.0000 g | INTRAVENOUS | Status: DC
  Filled 2016-06-29: qty 100

## 2016-06-29 MED ORDER — OXYCODONE-ACETAMINOPHEN 5-325 MG PO TABS: 2.0000 | ORAL_TABLET | ORAL | Status: DC | PRN

## 2016-06-29 MED ORDER — WITCH HAZEL-GLYCERIN EX PADS: 1.0000 "application " | MEDICATED_PAD | CUTANEOUS | Status: DC | PRN

## 2016-06-29 MED ORDER — SODIUM CHLORIDE 0.9 % IR SOLN
Status: DC | PRN
  Administered 2016-06-29: 1

## 2016-06-29 MED ORDER — PHENYLEPHRINE 8 MG IN D5W 100 ML (0.08MG/ML) PREMIX OPTIME: INJECTION | INTRAVENOUS | Status: DC | PRN

## 2016-06-29 MED ORDER — MORPHINE SULFATE-NACL 0.5-0.9 MG/ML-% IV SOSY
PREFILLED_SYRINGE | INTRAVENOUS | Status: AC
  Filled 2016-06-29: qty 1

## 2016-06-29 MED ORDER — LACTATED RINGERS IV SOLN
INTRAVENOUS | Status: DC
  Administered 2016-06-29: 16:00:00 via INTRAVENOUS

## 2016-06-29 MED ORDER — IBUPROFEN 600 MG PO TABS: 600.0000 mg | ORAL_TABLET | Freq: Four times a day (QID) | ORAL | Status: DC | PRN

## 2016-06-29 MED ORDER — LACTATED RINGERS IV SOLN
INTRAVENOUS | Status: DC | PRN
  Administered 2016-06-29: 11:00:00 via INTRAVENOUS

## 2016-06-29 MED ORDER — LACTATED RINGERS IV SOLN
INTRAVENOUS | Status: DC | PRN
  Administered 2016-06-29: 40 [IU] via INTRAVENOUS

## 2016-06-29 MED ORDER — OXYTOCIN 10 UNIT/ML IJ SOLN
INTRAMUSCULAR | Status: AC
  Filled 2016-06-29: qty 4

## 2016-06-29 MED ORDER — ACETAMINOPHEN 325 MG PO TABS
650.0000 mg | ORAL_TABLET | ORAL | Status: DC | PRN
  Administered 2016-06-30 – 2016-07-02 (×5): 650 mg via ORAL
  Filled 2016-06-29 (×5): qty 2

## 2016-06-29 MED ORDER — MENTHOL 3 MG MT LOZG: 1.0000 | LOZENGE | OROMUCOSAL | Status: DC | PRN

## 2016-06-29 MED ORDER — KETOROLAC TROMETHAMINE 30 MG/ML IJ SOLN: 30.0000 mg | Freq: Four times a day (QID) | INTRAMUSCULAR | Status: DC | PRN

## 2016-06-29 MED ORDER — SENNOSIDES-DOCUSATE SODIUM 8.6-50 MG PO TABS
2.0000 | ORAL_TABLET | ORAL | Status: DC
  Administered 2016-06-30 – 2016-07-02 (×3): 2 via ORAL
  Filled 2016-06-29 (×3): qty 2

## 2016-06-29 MED ORDER — ONDANSETRON HCL 4 MG/2ML IJ SOLN
INTRAMUSCULAR | Status: DC | PRN
  Administered 2016-06-29: 4 mg via INTRAVENOUS

## 2016-06-29 MED ORDER — SODIUM CHLORIDE 0.9% FLUSH
3.0000 mL | INTRAVENOUS | Status: DC | PRN
Start: 2016-06-29 — End: 2016-07-01

## 2016-06-29 MED ORDER — BUPIVACAINE IN DEXTROSE 0.75-8.25 % IT SOLN
INTRATHECAL | Status: DC | PRN
  Administered 2016-06-29: 1.6 mL via INTRATHECAL

## 2016-06-29 MED ORDER — MORPHINE SULFATE (PF) 0.5 MG/ML IJ SOLN
INTRAMUSCULAR | Status: DC | PRN
  Administered 2016-06-29: .2 mg via INTRATHECAL

## 2016-06-29 MED ORDER — DIPHENHYDRAMINE HCL 25 MG PO CAPS: 25.0000 mg | ORAL_CAPSULE | Freq: Four times a day (QID) | ORAL | Status: DC | PRN

## 2016-06-29 MED ORDER — ONDANSETRON HCL 4 MG/2ML IJ SOLN: 4.0000 mg | Freq: Three times a day (TID) | INTRAMUSCULAR | Status: DC | PRN

## 2016-06-29 MED ORDER — COCONUT OIL OIL
1.0000 | TOPICAL_OIL | Status: DC | PRN
Start: 2016-06-29 — End: 2016-07-02

## 2016-06-29 MED ORDER — PRENATAL MULTIVITAMIN CH
1.0000 | ORAL_TABLET | Freq: Every day | ORAL | Status: DC
  Administered 2016-06-30 – 2016-07-01 (×2): 1 via ORAL
  Filled 2016-06-29 (×2): qty 1

## 2016-06-29 SURGICAL SUPPLY — 37 items
BENZOIN TINCTURE PRP APPL 2/3 (GAUZE/BANDAGES/DRESSINGS) ×3 IMPLANT
CHLORAPREP W/TINT 26ML (MISCELLANEOUS) ×3 IMPLANT
CLAMP CORD UMBIL (MISCELLANEOUS) IMPLANT
CLOSURE STERI STRIP 1/2 X4 (GAUZE/BANDAGES/DRESSINGS) ×3 IMPLANT
CLOSURE WOUND 1/2 X4 (GAUZE/BANDAGES/DRESSINGS)
CLOTH BEACON ORANGE TIMEOUT ST (SAFETY) ×3 IMPLANT
CONTAINER PREFILL 10% NBF 15ML (MISCELLANEOUS) IMPLANT
DRSG OPSITE POSTOP 4X10 (GAUZE/BANDAGES/DRESSINGS) ×3 IMPLANT
ELECT REM PT RETURN 9FT ADLT (ELECTROSURGICAL) ×3
ELECTRODE REM PT RTRN 9FT ADLT (ELECTROSURGICAL) ×1 IMPLANT
EXTRACTOR VACUUM KIWI (MISCELLANEOUS) IMPLANT
EXTRACTOR VACUUM M CUP 4 TUBE (SUCTIONS) IMPLANT
EXTRACTOR VACUUM M CUP 4' TUBE (SUCTIONS)
GLOVE BIO SURGEON STRL SZ7 (GLOVE) ×3 IMPLANT
GLOVE BIOGEL PI IND STRL 7.0 (GLOVE) ×2 IMPLANT
GLOVE BIOGEL PI INDICATOR 7.0 (GLOVE) ×4
GOWN STRL REUS W/TWL LRG LVL3 (GOWN DISPOSABLE) ×6 IMPLANT
KIT ABG SYR 3ML LUER SLIP (SYRINGE) IMPLANT
NEEDLE HYPO 25X5/8 SAFETYGLIDE (NEEDLE) IMPLANT
NS IRRIG 1000ML POUR BTL (IV SOLUTION) ×3 IMPLANT
PACK C SECTION WH (CUSTOM PROCEDURE TRAY) ×3 IMPLANT
PAD OB MATERNITY 4.3X12.25 (PERSONAL CARE ITEMS) ×3 IMPLANT
RTRCTR C-SECT PINK 25CM LRG (MISCELLANEOUS) IMPLANT
STRIP CLOSURE SKIN 1/2X4 (GAUZE/BANDAGES/DRESSINGS) IMPLANT
SUT MON AB-0 CT1 36 (SUTURE) ×9 IMPLANT
SUT PLAIN 0 NONE (SUTURE) IMPLANT
SUT PLAIN 2 0 (SUTURE)
SUT PLAIN 2 0 XLH (SUTURE) ×3 IMPLANT
SUT PLAIN ABS 2-0 CT1 27XMFL (SUTURE) IMPLANT
SUT VIC AB 0 CT1 27 (SUTURE) ×4
SUT VIC AB 0 CT1 27XBRD ANBCTR (SUTURE) ×2 IMPLANT
SUT VIC AB 2-0 CT1 27 (SUTURE) ×6
SUT VIC AB 2-0 CT1 TAPERPNT 27 (SUTURE) ×3 IMPLANT
SUT VIC AB 4-0 KS 27 (SUTURE) IMPLANT
SUT VICRYL 0 TIES 12 18 (SUTURE) IMPLANT
TOWEL OR 17X24 6PK STRL BLUE (TOWEL DISPOSABLE) ×3 IMPLANT
TRAY FOLEY CATH SILVER 14FR (SET/KITS/TRAYS/PACK) IMPLANT

## 2016-06-29 NOTE — Anesthesia Postprocedure Evaluation (Signed)
Anesthesia Post Note  Patient: Diane Lester  Procedure(s) Performed: Procedure(s) (LRB): CESAREAN SECTION (N/A)  Patient location during evaluation: PACU Anesthesia Type: Spinal Level of consciousness: awake Pain management: pain level controlled Vital Signs Assessment: post-procedure vital signs reviewed and stable Respiratory status: spontaneous breathing Cardiovascular status: stable Postop Assessment: no headache, no backache, spinal receding, patient able to bend at knees and no signs of nausea or vomiting Anesthetic complications: no     Last Vitals:  Vitals:   06/29/16 1215 06/29/16 1230  BP: 127/69 107/69  Pulse: 93 84  Resp: 20 17  Temp:      Last Pain:  Vitals:   06/29/16 1139  TempSrc: Oral   Pain Goal:                 Towana Stenglein JR,JOHN Dawnelle Warman

## 2016-06-29 NOTE — MAU Note (Signed)
Dr. Juliene PinaMody in to discuss current status with patient and husband. Patient and husband opt for repeat C/S.

## 2016-06-29 NOTE — MAU Note (Signed)
Pt presents complaining of SROM at 0445 with clear fluid. Some spotting with wiping. No contractions at this time. Reports good fetal movement. Repeat c/s. Gestational hypertension

## 2016-06-29 NOTE — Anesthesia Preprocedure Evaluation (Signed)
Anesthesia Evaluation  Patient identified by MRN, date of birth, ID band Patient awake    Reviewed: Allergy & Precautions, H&P , NPO status , Patient's Chart, lab work & pertinent test results  Airway Mallampati: II  TM Distance: >3 FB Neck ROM: full    Dental no notable dental hx.    Pulmonary    Pulmonary exam normal        Cardiovascular hypertension, Normal cardiovascular exam     Neuro/Psych negative psych ROS   GI/Hepatic negative GI ROS, Neg liver ROS,   Endo/Other  Morbid obesity  Renal/GU negative Renal ROS     Musculoskeletal   Abdominal (+) + obese,   Peds  Hematology negative hematology ROS (+)   Anesthesia Other Findings   Reproductive/Obstetrics (+) Pregnancy                             Anesthesia Physical Anesthesia Plan  ASA: III  Anesthesia Plan: Spinal   Post-op Pain Management:    Induction:   Airway Management Planned:   Additional Equipment:   Intra-op Plan:   Post-operative Plan:   Informed Consent: I have reviewed the patients History and Physical, chart, labs and discussed the procedure including the risks, benefits and alternatives for the proposed anesthesia with the patient or authorized representative who has indicated his/her understanding and acceptance.     Plan Discussed with: CRNA and Surgeon  Anesthesia Plan Comments:         Anesthesia Quick Evaluation

## 2016-06-29 NOTE — Anesthesia Procedure Notes (Signed)
Spinal  Patient location during procedure: OR Start time: 06/29/2016 10:18 AM End time: 06/29/2016 10:23 AM Staffing Anesthesiologist: Leilani AbleHATCHETT, Lolah Coghlan Performed: anesthesiologist  Preanesthetic Checklist Completed: patient identified, site marked, surgical consent, pre-op evaluation, timeout performed, IV checked, risks and benefits discussed and monitors and equipment checked Spinal Block Patient position: sitting Prep: site prepped and draped and DuraPrep Patient monitoring: heart rate, cardiac monitor, continuous pulse ox and blood pressure Approach: midline Location: L3-4 Injection technique: single-shot Needle Needle type: Sprotte  Needle gauge: 24 G Needle length: 9 cm Needle insertion depth: 7 cm Assessment Sensory level: T4

## 2016-06-29 NOTE — Anesthesia Postprocedure Evaluation (Signed)
Anesthesia Post Note  Patient: Diane Lester  Procedure(s) Performed: Procedure(s) (LRB): CESAREAN SECTION (N/A)  Patient location during evaluation: Mother Baby Anesthesia Type: Spinal Level of consciousness: awake and alert and oriented Pain management: satisfactory to patient Vital Signs Assessment: post-procedure vital signs reviewed and stable Respiratory status: respiratory function stable and spontaneous breathing Cardiovascular status: blood pressure returned to baseline Postop Assessment: no headache, no backache, spinal receding, patient able to bend at knees and adequate PO intake Anesthetic complications: no     Last Vitals:  Vitals:   06/29/16 1600 06/29/16 1700  BP: 114/62 (!) 108/59  Pulse: 68   Resp: 17 18  Temp: 36.8 C 36.4 C    Last Pain:  Vitals:   06/29/16 1817  TempSrc:   PainSc: 1    Pain Goal: Patients Stated Pain Goal: 2 (06/29/16 1817)               Karleen DolphinFUSSELL,Stefani Baik

## 2016-06-29 NOTE — Addendum Note (Signed)
Addendum  created 06/29/16 2233 by Graciela HusbandsWynn O Genoa Freyre, CRNA   Sign clinical note

## 2016-06-29 NOTE — H&P (Addendum)
Diane Lester is a 35 y.o. female IVF preg, G3P1011, presenting at 36.4 wks with SROM around 5 am, clear fluid. Prior C/s at 37 wks in [facility] for GHTN and breech with NICU admit for 5 days for RDS.  This is IVF pregnancy. She has GHTN in this pregnancy as well. BPs elevated since early Oct, when father-in-law had MI but BPs have remained stable, no antiHTN meds needed. PIH labs incl urine are normal. Normal fetal growth, no placental insufficiency.[facility].  Good FMs, no UCs or bleeding. GBS(+), PCN started as patient was not sure if she wanted RC/s or VBAC at admission.  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 Asthma   . Endometriosis   . Infertility, female    IVF pregnancy   Past Surgical History:  Procedure Laterality Date  . CESAREAN SECTION    . CHOLECYSTECTOMY    . KNEE ARTHROSCOPY Left   . LAPAROSCOPY     twice  . NASAL SINUS SURGERY    . WISDOM TOOTH EXTRACTION     Family History: family history includes Aneurysm in her paternal aunt; Diabetes in her maternal grandfather, mother, and paternal grandfather; Heart disease in her maternal grandfather. Social History:  reports that she has never smoked. She has never used smokeless tobacco. She reports that she does not drink alcohol or use drugs.     Maternal Diabetes: No Genetic Screening: Normal Maternal Ultrasounds/Referrals: Normal Fetal Ultrasounds or other Referrals:  None Maternal Substance Abuse:  No Significant Maternal Medications:  ASA 81 mg for h/o GHTN Significant Maternal Lab Results:  Lab values include: Group B Strep positive Other Comments:  None  ROS no SOB/ CP/ swelling/ vision prob/ HAs History Blood pressure 118/73, pulse 97, temperature 98.2 F (36.8 C), temperature source  Oral, resp. rate 16, height 5\' 7"  (1.702 m), weight 260 lb (117.9 kg), last menstrual period 08/29/2015, SpO2 97 %. Exam Physical Exam  BP 132/79   Pulse 111   Temp 98.2 F (36.8 C) (Oral)   Resp 16   Ht 5\' 7"  (1.702 m)   Wt 260 lb (117.9 kg)   LMP 08/29/2015   SpO2 97%   BMI 40.72 kg/m   A&O x 3, no acute distress. Pleasant HEENT neg, no thyromegaly Lungs CTA bilat CV RRR,S1S2 normal Abdo soft, non tender, non acute Extr no edema/ tenderness. DTR +2 Pelvic closed long, but soft , stn -5. Cephalic by bedside sono  FHT  150s/ cat I  Toco occasional   Prenatal labs: ABO, Rh: --/--/A POS (11/20 0820) Antibody: NEG (11/20 0820) Rubella:  Immune RPR:   NR HBsAg:   Neg HIV:   Neg GBS:   Positive Glucola normal Ultrascreen / NT and AFP1 normal   Assessment/Plan: 35 yo G3P1011, 36.4 wks, SROM. GHTN. Prior C/section. After reviewing options of VBAC v/s RC/s and having her counseled again after her husband arrived back, she does not want to go through long potential 24-36 hr induction. She chose to have repeat C/section. Declined tubal sterilization.   GBS(+), PCN was started while decision making process, but stopped after 1/2 infused as she'll  get Ancef in OR BTMZ #1 given since <37 wks in case she was going to labor, nevertheless baby will still have some benefit but considering SROM , will proceed with RC/s now.   Risks/complications of surgery reviewed incl infection, bleeding, damage to internal organs including bladder, bowels, ureters, blood vessels, other risks from anesthesia, VTE and delayed complications of any surgery, complications in future surgery reviewed. Also discussed neonatal complications incl difficult delivery, laceration, vacuum assistance, TTN etc. Pt understands and agrees, all concerns addressed.     Callista Hoh R 06/29/2016, 9:54 AM

## 2016-06-29 NOTE — Op Note (Signed)
Cesarean Section Procedure Note 06/29/2016  Diane Lester   Indications: 36.4 wks, Spontaneous rupture of membranes. Prior C-section, declined vaginal trial   Procedure: Repeat Low transverse cesarean section  Pre-operative Diagnosis: 36.4 wks, SROM. Prior C-section  Post-operative Diagnosis: Same   Surgeon: Shea EvansVaishali Shaynna Husby, MD   Assistants: Arlan Organaniela Paul, CNM  Anesthesia: spinal   Procedure Details:  The patient was seen in the Holding Room. The risks, benefits, complications, treatment options, and expected outcomes were discussed with the patient. The patient concurred with the proposed plan, giving informed consent. identified as Diane Lester and the procedure verified as C-Section Delivery. A Time Out was held and the above information confirmed.  After induction of anesthesia, the patient was draped and prepped in the usual sterile manner. A Pfannenstiel Incision was made and carried down through the subcutaneous tissue to the fascia. Fascial incision was made and extended transversely. The fascia was separated from the underlying rectus tissue superiorly and inferiorly. The peritoneum was identified and entered and carefully dissected as muscles were adherent in the midline from prior surgery, carefully watching bladder. Peritoneal incision was extended longitudinally. Alexis retractor placed. The utero-vesical peritoneal reflection was incised transversely and the bladder flap was bluntly freed from the lower uterine segment. A low transverse uterine incision was made. Delivered from cephalic presentation was a viable female infant at 10.51 am on 06/29/16, delayed cord clamping done at 1 minute and baby handed to NICU team in attendance,  Apgar scores of 8 at one minute and 9 at five minutes. Cord blood was obtained for evaluation. The placenta was removed Intact and appeared normal. The uterine outline, tubes and ovaries appeared normal}. The uterine incision was closed with running  locked sutures of 0Monocryl followed by second imbricating layer. Hemostasis was observed. Alexis retractor removed. Peritoneal closure done with 2-0 Vicryl after releasing few omental adhesions. The fascia was then reapproximated with running sutures of 0Vicryl. The subcuticular closure was performed using 2-0plain gut. The skin was closed with 4-0Vicryl. Sterile dressing placed.   Instrument, sponge, and needle counts were correct prior the abdominal closure and were correct at the conclusion of the case.   Findings: Female infant delivered cephalic from repeat Kerr hysterotomy without complications. Apgars 8 and 9. Normal ovaries and tubes.    Estimated Blood Loss: 600 cc   Total IV Fluids:  2400 ml LR   Urine Output: 300CC OF clear urine  Specimens: Cord blood  Complications: no complications  Disposition: PACU - hemodynamically stable.   Maternal Condition: stable   Baby condition / location:  Couplet care / Skin to Skin  Attending Attestation: I performed the procedure.   Signed: Surgeon(s): Shea EvansVaishali Shad Ledvina, MD

## 2016-06-29 NOTE — Lactation Note (Addendum)
This note was copied from a baby's chart. Lactation Consultation Note  P2, Baby 4 hours old. 36 w 4d. Son was in NICU and mother was unable to latch so she pumped for undetermined time. Mother's nipples are flat. Taught her to prepump before latching. Mother has rusty pipe.  She was able to hand express approx 9 ml of colostrum into spoon which was given to baby. Attempted in cross cradle and football hold with and without #20NS.  Baby sucked a few times and fell asleep. Set up DEBP and pumped for a few minutes. Encouraged mother to pump every 3 hours 10-20 min and give volume pumped back to baby. Reviewed LPI feeding plan. Mom made aware of O/P services, breastfeeding support groups, community resources, and our phone # for post-discharge questions.     Patient Name: Diane Lester WJXBJ'YToday's Date: 06/29/2016 Reason for consult: Late preterm infant;Initial assessment   Maternal Data Has patient been taught Hand Expression?: Yes Does the patient have breastfeeding experience prior to this delivery?: Yes  Feeding Feeding Type: Breast Fed Length of feed: 5 min (suckled)  LATCH Score/Interventions Latch: Repeated attempts needed to sustain latch, nipple held in mouth throughout feeding, stimulation needed to elicit sucking reflex. Intervention(s): Adjust position;Assist with latch;Breast massage;Breast compression  Audible Swallowing: None Intervention(s): Skin to skin;Hand expression  Type of Nipple: Flat Intervention(s): Shells;Hand pump;Double electric pump  Comfort (Breast/Nipple): Soft / non-tender     Hold (Positioning): No assistance needed to correctly position infant at breast.  LATCH Score: 6  Lactation Tools Discussed/Used Tools: Shells;Nipple Diane Lester;Pump Nipple shield size: 20 Shell Type: Inverted Breast pump type: Double-Electric Breast Pump   Consult Status Consult Status: Follow-up Date: 06/30/16 Follow-up type: In-patient    Diane Lester,  Diane Lester 06/29/2016, 4:14 PM

## 2016-06-29 NOTE — MAU Note (Signed)
Circulation, OR co-ordinator, Anes,, house coverage, nursery notified of c/s

## 2016-06-29 NOTE — Transfer of Care (Signed)
Immediate Anesthesia Transfer of Care Note  Patient: Diane Lester  Procedure(s) Performed: Procedure(s): CESAREAN SECTION (N/A)  Patient Location: PACU  Anesthesia Type:Spinal  Level of Consciousness: awake, alert  and oriented  Airway & Oxygen Therapy: Patient Spontanous Breathing  Post-op Assessment: Report given to RN and Post -op Vital signs reviewed and stable  Post vital signs: Reviewed and stable  Last Vitals:  Vitals:   06/29/16 0830 06/29/16 1008  BP: 118/73 132/79  Pulse: 97 111  Resp: 16   Temp: 36.8 C     Last Pain:  Vitals:   06/29/16 0830  TempSrc: Oral         Complications: No apparent anesthesia complications

## 2016-06-30 ENCOUNTER — Encounter (HOSPITAL_COMMUNITY): Payer: Self-pay | Admitting: Obstetrics and Gynecology

## 2016-06-30 DIAGNOSIS — D509 Iron deficiency anemia, unspecified: Secondary | ICD-10-CM

## 2016-06-30 DIAGNOSIS — D62 Acute posthemorrhagic anemia: Secondary | ICD-10-CM

## 2016-06-30 DIAGNOSIS — O99019 Anemia complicating pregnancy, unspecified trimester: Secondary | ICD-10-CM | POA: Diagnosis present

## 2016-06-30 HISTORY — DX: Acute posthemorrhagic anemia: D62

## 2016-06-30 HISTORY — DX: Iron deficiency anemia, unspecified: D50.9

## 2016-06-30 HISTORY — DX: Iron deficiency anemia, unspecified: O99.019

## 2016-06-30 LAB — CBC
HCT: 24.6 % — ABNORMAL LOW (ref 36.0–46.0)
Hemoglobin: 8.7 g/dL — ABNORMAL LOW (ref 12.0–15.0)
MCH: 31.4 pg (ref 26.0–34.0)
MCHC: 35.4 g/dL (ref 30.0–36.0)
MCV: 88.8 fL (ref 78.0–100.0)
Platelets: 226 10*3/uL (ref 150–400)
RBC: 2.77 MIL/uL — ABNORMAL LOW (ref 3.87–5.11)
RDW: 13.9 % (ref 11.5–15.5)
WBC: 14.2 10*3/uL — ABNORMAL HIGH (ref 4.0–10.5)

## 2016-06-30 MED ORDER — MAGNESIUM OXIDE 400 (241.3 MG) MG PO TABS
400.0000 mg | ORAL_TABLET | Freq: Every day | ORAL | Status: DC
  Administered 2016-06-30 – 2016-07-02 (×3): 400 mg via ORAL
  Filled 2016-06-30 (×3): qty 1

## 2016-06-30 MED ORDER — POLYSACCHARIDE IRON COMPLEX 150 MG PO CAPS
150.0000 mg | ORAL_CAPSULE | Freq: Two times a day (BID) | ORAL | Status: DC
  Administered 2016-06-30 – 2016-07-02 (×4): 150 mg via ORAL
  Filled 2016-06-30 (×3): qty 1

## 2016-06-30 NOTE — Lactation Note (Signed)
This note was copied from a baby's chart. Lactation Consultation Note  Baby 8255w4d is starting to get sleepy at the breast so we have started supplementing w/ Alimentum. Mother has been pumping approx 5-6 ml per session and is giving it to baby. Mother's nipples are flat, evert slightly w/ stimulation. Mother latched baby w/ #20NS and baby had intermittent sucks for approx 8 min. Prefilled NS with Alimentum to stimulate baby. Taught parents how to finger syringe feed.  Baby took 5 ml.  Parents w/ follow up w/ more volume.  Goal today is 10-20 ml per feeding. Praised parents for their efforts.  Patient Name: Girl [name]'UToday's Date: 06/30/2016 Reason for consult: Follow-up assessment   Maternal Data    Feeding Feeding Type: Breast Fed Length of feed: 8 min  LATCH Score/Interventions Latch: Repeated attempts needed to sustain latch, nipple held in mouth throughout feeding, stimulation needed to elicit sucking reflex.  Audible Swallowing: A few with stimulation Intervention(s): Skin to skin;Hand expression  Type of Nipple: Flat Intervention(s): Hand pump;Double electric pump  Comfort (Breast/Nipple): Soft / non-tender     Hold (Positioning): No assistance needed to correctly position infant at breast.  LATCH Score: 7  Lactation Tools Discussed/Used Tools: Nipple Shields Nipple shield size: 20   Consult Status      Hardie PulleyBerkelhammer, Haja Crego Boschen 06/30/2016, 12:33 PM

## 2016-06-30 NOTE — Progress Notes (Signed)
Patient ID: Diane Lester, female   DOB: 02-10-1981, 35 y.o.   MRN: [id number] Subjective: S/P Elective Repeat Cesarean Delivery d/t PROM POD# 1 Information for the patient's newborn:  Diane Lester, Diane Lester [[id number]][[id number]]  female "Diane Lester"  Reports feeling well.  "Feels much better than with last c/s." Desires an early d/c home tomorrow. Feeding: breast Patient reports tolerating PO.  Breast symptoms: (+) colostrum Pain controlled with ibuprofen (OTC) and narcotic analgesics including Percocet Denies HA/SOB/C/P/N/V/dizziness. Flatus present. No BM. She reports vaginal bleeding as normal, without clots.  She is ambulating, urinating without difficult.     Objective:   VS:  Vitals:   06/29/16 1930 06/29/16 2130 06/30/16 0100 06/30/16 0555  BP:  117/69 119/72 (!) 108/57  Pulse:  74 75 64  Resp:  18 18 18   Temp:  98 F (36.7 C) 98 F (36.7 C) 98.4 F (36.9 C)  TempSrc:  Oral Oral Oral  SpO2: 96% 97% 97% 98%  Weight:      Height:          Intake/Output Summary (Last 24 hours) at 06/30/16 0852 Last data filed at 06/30/16 0555  Gross per 24 hour  Intake          4163.33 ml  Output             2225 ml  Net          1938.33 ml         Recent Labs  06/29/16 0820 06/30/16 0539  WBC 8.8 14.2*  HGB 10.1* 8.7*  HCT 29.8* 24.6*  PLT 228 226     Blood type: --/--/A POS (11/20 0825)  Rubella: Immune    Physical Exam:   General: alert, cooperative, fatigued, no distress and mildly obese  CV: Regular rate and rhythm, S1S2 present or without murmur or extra heart sounds  Resp: clear  Abdomen: soft, nontender, normal bowel sounds  Incision: clean, dry, intact and skin well-approximated with sutures  Uterine Fundus: firm, 1 FB below umbilicus, nontender  Lochia: minimal  Ext: extremities normal, atraumatic, no cyanosis, edema trace and Homans sign is negative, no sign of DVT   Assessment/Plan: 35 y.o.   POD# 1.  S/P Cesarean Delivery.  Indications: elective repeat                 Principal Problem:   Postpartum care following cesarean delivery (11/20) Indication: repeat Active Problems:   PROM (premature rupture of membranes)   Gestational hypertension without significant proteinuria in third trimester   Cesarean delivery delivered   Maternal iron deficiency anemia complicating pregnancy   Postoperative anemia due to acute blood loss  Doing well, stable.               Regular diet as tolerated D/C IV per unit protocol Ambulate Routine post-op care Start Niferex 150 mg po BID Start Magnesium Oxide 400 mg po daily Consider early d/c home tomorrow  Raelyn MoraAWSON, Edman Lipsey, M, MSN, CNM 06/30/2016, 8:50 AM

## 2016-07-01 MED ORDER — MAGNESIUM OXIDE 400 (241.3 MG) MG PO TABS: 400.0000 mg | ORAL_TABLET | Freq: Every day | ORAL | 0 refills | Status: AC

## 2016-07-01 MED ORDER — OXYCODONE-ACETAMINOPHEN 5-325 MG PO TABS: 1.0000 | ORAL_TABLET | ORAL | 0 refills | Status: AC | PRN

## 2016-07-01 MED ORDER — POLYSACCHARIDE IRON COMPLEX 150 MG PO CAPS: 150.0000 mg | ORAL_CAPSULE | Freq: Two times a day (BID) | ORAL | 0 refills | Status: AC

## 2016-07-01 MED ORDER — IBUPROFEN 600 MG PO TABS: 600.0000 mg | ORAL_TABLET | Freq: Four times a day (QID) | ORAL | 0 refills | Status: AC

## 2016-07-01 NOTE — Lactation Note (Signed)
This note was copied from a baby's chart. Lactation Consultation Note Patient Name: Diane Luanna ColeKelly Lyster JXBJY'NToday's Date: 07/01/2016 Reason for consult: Follow-up assessment  Mom reports that infant is currently formula feeding, as she is having a hard time w/pumping & breastfeeding. She is concerned that having received bad news yesterday may have "tanked" her supply. Mom expresses an interest in breastfeeding. There is an older visitor in room & Mom is interested in discussing this further later. Mom has my # to call when ready for consult.  Lurline HareRichey, Erric Machnik [facility] 07/01/2016, 4:43 PM

## 2016-07-01 NOTE — Discharge Summary (Signed)
OB Discharge Summary  Patient Name: Diane Lester DOB: [birth year] MRN: [id number]  Date of admission: 06/29/2016  Admitting diagnosis: 36 wks water broke Intrauterine pregnancy: 8396w6d     Secondary diagnosis: previous Cs - requested repeat   Date of discharge: 07/02/2016     Discharge diagnosis: Term Pregnancy Delivered      Prenatal history: G2P0101   EDC : 07/23/2016, Date entered prior to episode creation  [facility]  Primary provider : Mody Prenatal course complicated by previous CS  Prenatal Labs: ABO, Rh: --/--/A POS (11/20 0825)  Antibody: NEG (11/20 0820) Rubella:    Immune RPR: Non Reactive (11/20 0820)  HBsAg:   NEg HIV:   NR                             Hospital course:  Onset of Labor With Unplanned C/S  35 y.o. yo G2P0101 at 8096w6d was admitted in Latent Labor on 06/29/2016. Patient had a labor course significant for SROM Membrane Rupture Time/Date: 4:50 AM ,06/29/2016   The patient went for cesarean section due to Elective Repeat, and delivered a Viable infant,06/29/2016  Details of operation can be found in separate operative note. Patient had an uncomplicated postpartum course.  She is ambulating,tolerating a regular diet, passing flatus, and urinating well.  Patient is discharged home in stable condition 07/02/16.  Delivering PROVIDER: MODY, VAISHALI                                                            Complications: None  Newborn Data: Live born female  Birth Weight: 7 lb 1.2 oz (3210 g) APGAR: 8, 9  Baby Feeding: Breast Disposition:home with mother  Post partum procedures:none  Postpartum contraception: Not Discussed    Labs: Lab Results  Component Value Date   WBC 14.2 (H) 06/30/2016   HGB 8.7 (L) 06/30/2016   HCT 24.6 (L) 06/30/2016   MCV 88.8 06/30/2016   PLT 226 06/30/2016   CMP Latest Ref Rng & Units 06/29/2016  Glucose 65 - 99 mg/dL 87  BUN 6 - 20 mg/dL 7  Creatinine 4.090.44 - 8.111.00  mg/dL 9.140.54  Sodium 782135 - 956145 mmol/L 134(L)  Potassium 3.5 - 5.1 mmol/L 3.7  Chloride 101 - 111 mmol/L 107  CO2 22 - 32 mmol/L 19(L)  Calcium 8.9 - 10.3 mg/dL 8.9  Total Protein 6.5 - 8.1 g/dL 6.9  Total Bilirubin 0.3 - 1.2 mg/dL 0.4  Alkaline Phos 38 - 126 U/L 101  AST 15 - 41 U/L 27  ALT 14 - 54 U/L 18    Physical Exam @ time of discharge:  Vitals:   06/30/16 0555 06/30/16 1000 06/30/16 1800 07/01/16 0534  BP: (!) 108/57 118/66 127/65 104/72  Pulse: 64 89 69 68  Resp: 18 18 18 18   Temp: 98.4 F (36.9 C) 98.2 F (36.8 C) 98.8 F (37.1 C) 98.4 F (36.9 C)  TempSrc: Oral Oral Oral Oral  SpO2: 98% 99%    Weight:      Height:        General: alert, cooperative and no distress Lochia: appropriate Uterine Fundus: firm Perineum: intact Incision: Healing well with no significant drainage Extremities: DVT Evaluation: No  evidence of DVT seen on physical exam.   Discharge instructions:  "Baby and Me Booklet" and [facility] Booklet  Discharge Medications:    Medication List    TAKE these medications   CITRANATAL HARMONY 27-1-260 MG Caps Take 1 capsule by mouth at bedtime.   ibuprofen 600 MG tablet Commonly known as:  ADVIL,MOTRIN Take 1 tablet (600 mg total) by mouth every 6 (six) hours.   iron polysaccharides 150 MG capsule Commonly known as:  NIFEREX Take 1 capsule (150 mg total) by mouth 2 (two) times daily.   magnesium oxide 400 (241.3 Mg) MG tablet Commonly known as:  MAG-OX Take 1 tablet (400 mg total) by mouth daily.   oxyCODONE-acetaminophen 5-325 MG tablet Commonly known as:  PERCOCET/ROXICET Take 1 tablet by mouth every 4 (four) hours as needed (pain scale 4-7).       Diet: routine diet  Activity: Advance as tolerated. Pelvic rest x 6 weeks.   Follow up:6 weeks    Signed: Marlinda MikeBAILEY, TANYA CNM, MSN, [facility] 07/01/2016, 9:50 AM

## 2016-07-01 NOTE — Progress Notes (Signed)
Addendum - Peds not discharging newborn - patient does nto want discharge today. Cancel DC - given RX and WOB booklet for DC tomorrow.   Marlinda Mikeanya Tzippy Testerman CNM

## 2016-07-01 NOTE — Progress Notes (Signed)
POSTOPERATIVE DAY # 2 S/P CS-repeat  S:         Reports feeling well - wants to go home             Tolerating po intake /  nausea / no vomiting / = flatus / no BM             Bleeding is light             Pain controlled with motrin and percocet             Up ad lib / ambulatory/ voiding QS  Newborn breast feeding   O:  VS: BP 104/72 (BP Location: Right Arm)   Pulse 68   Temp 98.4 F (36.9 C) (Oral)   Resp 18   Ht 5\' 7"  (1.702 m)   Wt 117.9 kg (260 lb)   LMP 08/29/2015   SpO2 99%   BMI 40.72 kg/m    LABS:               Recent Labs  06/29/16 0820 06/30/16 0539  WBC 8.8 14.2*  HGB 10.1* 8.7*  PLT 228 226              Physical Exam:             Alert and Oriented X3  Lungs: Clear and unlabored  Heart: regular rate and rhythm / no mumurs  Abdomen: soft, non-tender, non-distended              Fundus: firm, non-tender, Ueven             Dressing intact              Incision:  approximated with suture / no erythema / no ecchymosis / no drainage  Perineum: intact  Lochia: light  Extremities: no edema, no calf pain or tenderness, negative Homans  A:        POD # 2 S/P R-CS            IDA with ABL anemia  P:        Routine postoperative care              WOB booklet - instructions reveiwed             Iron and magnesium x 6 weeks                Diane Lester, Diane Lester CNM, MSN, [facility] ClubFACNM 07/01/2016, 8:56 AM

## 2016-07-02 NOTE — Lactation Note (Signed)
This note was copied from a baby's chart. Lactation Consultation Note  Patient Name: Diane Lester [id number] Date: 07/02/2016 Reason for consult: Follow-up assessment (6% weight loss. per mom plans to pump and bottle )  Baby is 80 hours old and per mom plans only to pump and bottle feed due to the baby's frenulum issue.  LC reviewed supply and demand. And LC stressed the importance of consistent pumping at least 8 x's  A day, about every 2-3 hours, and try not to go over 4, also at night. LC stressed the importance of the 1st 2 weeks  Of breast feeding and consistent pumping for 15 -20 mins. Also hand expressing with pumping will enhance volume.  Mom denies soreness. Sore nipple and engorgement prevention and tx reviewed.  Per mom has a DEBP Septra at home from [facility]. With her 1st baby she rented a Therapist, music.  Mom plans to take her DEBP Medela kit with her just incase she has to rent a DEBP for milk supply.  Mother informed of post-discharge support and given phone number to the lactation department, including services for phone call assistance; out-patient appointments; and breastfeeding support group. List of other breastfeeding resources in the community given in the handout. Encouraged mother to call for problems or concerns related to breastfeeding.   Maternal Data    Feeding Feeding Type: Bottle Fed - Formula  LATCH Score/Interventions                Intervention(s): Breastfeeding basics reviewed     Lactation Tools Discussed/Used WIC Program: No   Consult Status Consult Status: Complete Date: 07/02/16    Myer Haff 07/02/2016, 10:54 AM

## 2016-07-02 NOTE — Progress Notes (Signed)
Subjective: POD# 3 Information for the patient's newborn:  Theodoro ParmaDennison, Girl Alisen [[id number]][[id number]]  female  Baby name: Jaclyn Primella Rose  Reports feeling well, ready for DC. Baby passe cardiac screening and fetal echo normal.  Feeding: breast and bottle Patient reports tolerating PO.  Breast symptoms: inverted nipples Pain controlled with PO meds Denies HA/SOB/C/P/N/V/dizziness. Flatus present. She reports vaginal bleeding as normal, without clots.  She is ambulating, urinating without difficulty.     Objective:   VS:    Vitals:   06/30/16 1800 07/01/16 0534 07/01/16 1833 07/02/16 0510  BP: 127/65 104/72 119/72 133/81  Pulse: 69 68 74 79  Resp: 18 18 20 18   Temp: 98.8 F (37.1 C) 98.4 F (36.9 C) 98.3 F (36.8 C) 98 F (36.7 C)  TempSrc: Oral Oral Oral Oral  SpO2:   99%   Weight:      Height:        No intake or output data in the 24 hours ending 07/02/16 0858      Recent Labs  06/30/16 0539  WBC 14.2*  HGB 8.7*  HCT 24.6*  PLT 226     Blood type: --/--/A POS (11/20 0825)  Rubella:   immune    Physical Exam:  General: alert, cooperative and no distress Abdomen: soft, nontender, normal bowel sounds Incision: clean, dry and intact Uterine Fundus: firm, below umbilicus, nontender Lochia: minimal Ext: edema ++, no cords or calf tenderness      Assessment/Plan: 35 y.o.   POD# 3. [id number]                  Principal Problem:   Postpartum care following cesarean delivery (11/20) Indication: repeat Active Problems:   PROM (premature rupture of membranes)   Gestational hypertension without significant proteinuria in third trimester   Cesarean delivery delivered   Maternal iron deficiency anemia complicating pregnancy   Postoperative anemia due to acute blood loss  - oral Fe and MagOx 4-6 wks PP  Doing well, stable.     No s/s PEC, diuresing well, normotensive          Encourage rest when baby rests Breastfeeding support, LC f/u outside hospital Encourage to  ambulate Routine post-op care DC home with PP instructions  Neta Mendsaniela C Paul, CNM, MSN 07/02/2016, 8:58 AM

## 2016-07-05 ENCOUNTER — Telehealth (HOSPITAL_COMMUNITY): Payer: Self-pay

## 2016-07-05 NOTE — Telephone Encounter (Signed)
Mom calling about engorgement care related to poor latch and then pumping for 6 day old baby. Mom reports frequent pumping every 2-3 hours and getting up to 8 oz.  Mom noticed a small red area with warm on right breast today.  Discussed plugged duct and engorgement care.  Mom to call again as needed.

## 2016-07-09 ENCOUNTER — Encounter (HOSPITAL_COMMUNITY): Admission: RE | Payer: Self-pay | Source: Ambulatory Visit

## 2016-07-09 ENCOUNTER — Inpatient Hospital Stay (HOSPITAL_COMMUNITY): Admission: RE | Admit: 2016-07-09 | Payer: 59 | Source: Ambulatory Visit | Admitting: Obstetrics

## 2016-07-09 SURGERY — Surgical Case
Anesthesia: Spinal

## 2016-07-13 ENCOUNTER — Encounter (HOSPITAL_COMMUNITY): Payer: Self-pay | Admitting: *Deleted

## 2016-10-09 ENCOUNTER — Encounter (HOSPITAL_COMMUNITY): Payer: Self-pay | Admitting: Emergency Medicine

## 2016-10-09 ENCOUNTER — Emergency Department (HOSPITAL_COMMUNITY): Payer: 59

## 2016-10-09 DIAGNOSIS — J45909 Unspecified asthma, uncomplicated: Secondary | ICD-10-CM | POA: Diagnosis not present

## 2016-10-09 DIAGNOSIS — Z79899 Other long term (current) drug therapy: Secondary | ICD-10-CM | POA: Diagnosis not present

## 2016-10-09 DIAGNOSIS — R079 Chest pain, unspecified: Secondary | ICD-10-CM | POA: Diagnosis not present

## 2016-10-09 LAB — CBC
HEMATOCRIT: 37 % (ref 36.0–46.0)
Hemoglobin: 12.1 g/dL (ref 12.0–15.0)
MCH: 28.7 pg (ref 26.0–34.0)
MCHC: 32.7 g/dL (ref 30.0–36.0)
MCV: 87.7 fL (ref 78.0–100.0)
Platelets: 300 10*3/uL (ref 150–400)
RBC: 4.22 MIL/uL (ref 3.87–5.11)
RDW: 14 % (ref 11.5–15.5)
WBC: 5.7 10*3/uL (ref 4.0–10.5)

## 2016-10-09 LAB — BASIC METABOLIC PANEL
ANION GAP: 11 (ref 5–15)
BUN: 11 mg/dL (ref 6–20)
CHLORIDE: 103 mmol/L (ref 101–111)
CO2: 24 mmol/L (ref 22–32)
Calcium: 9.7 mg/dL (ref 8.9–10.3)
Creatinine, Ser: 0.79 mg/dL (ref 0.44–1.00)
GFR calc non Af Amer: >60 mL/min (ref >60)
Glucose, Bld: 118 mg/dL — ABNORMAL HIGH (ref 65–99)
POTASSIUM: 3.5 mmol/L (ref 3.5–5.1)
SODIUM: 138 mmol/L (ref 135–145)

## 2016-10-09 LAB — I-STAT TROPONIN, ED: Troponin i, poc: 0 ng/mL (ref 0.00–0.08)

## 2016-10-09 NOTE — ED Triage Notes (Signed)
Pt to ED from home c/o new onset central CP with radiation to her throat initially x 45 minutes, accompanied by N/V (emesis x 1). Pt reports she "dry swallowed a small pill" 20 mins before this happened and has been "belching" since. Pt feels like that may be r/t to her pain, but wants to be sure. Skin warm, dry, resp e/u.

## 2016-10-10 ENCOUNTER — Emergency Department (HOSPITAL_COMMUNITY)
Admission: EM | Admit: 2016-10-10 | Discharge: 2016-10-10 | Disposition: A | Discharge status: Home / Self Care | Payer: 59 | Attending: Emergency Medicine | Admitting: Emergency Medicine

## 2016-10-10 DIAGNOSIS — R079 Chest pain, unspecified: Secondary | ICD-10-CM

## 2016-10-10 NOTE — ED Provider Notes (Signed)
[facility]-EMERGENCY DEPT Provider Note   CSN: [id number] Arrival date & time: 10/09/16  2204  By signing my name below, I, Cynda Acres, attest that this documentation has been prepared under the direction and in the presence of Dione Booze, MD. Electronically Signed: Cynda Acres, Scribe. 10/10/16. 12:38 AM.  History   Chief Complaint Chief Complaint  Patient presents with  . Chest Pain    HPI Comments: Diane Lester is a 36 y.o. female who presents to the Emergency Department complaining of sudden-onset chest pain that began earlier tonight at 9 PM. Patient reports taking a new medication (Zoloft). Patient states her episode ended 30 minutes ago. Patient reports "dry swallowing a small pill". Patient has associated radiation to the throat and vomiting (x1 episode). Patient describes her pain as achy, heavy. No modifying factors indicated. Nothing makes the pain better or worse. Patient reports a history of heart problems at old age (grandmother and grandfather). Patient denies any shortness of breath, nausea, or diaphoresis.   The history is provided by the patient. No language interpreter was used.    Past Medical History:  Diagnosis Date  . Asthma   . Endometriosis   . Gestational hypertension without significant proteinuria in third trimester 06/29/2016  . Infertility, female    IVF pregnancy  . Maternal iron deficiency anemia complicating pregnancy 06/30/2016  . Postoperative anemia due to acute blood loss 06/30/2016  . PROM (premature rupture of membranes) 06/29/2016    Patient Active Problem List   Diagnosis Date Noted  . Maternal iron deficiency anemia complicating pregnancy 06/30/2016  . Postoperative anemia due to acute blood loss 06/30/2016  . PROM (premature rupture of membranes) 06/29/2016  . Gestational hypertension without significant proteinuria in third trimester 06/29/2016  . Cesarean delivery delivered 06/29/2016  . Postpartum care following cesarean delivery  (11/20) Indication: repeat 06/29/2016  . Acute sinusitis 12/29/2014  . Endometriosis determined by laparoscopy 11/13/2014  . History of pre-eclampsia 11/13/2014  . Migraine 10/16/2014  . Obesity 10/16/2014  . Asthma, mild intermittent 10/16/2014  . Seasonal allergies 10/16/2014    Past Surgical History:  Procedure Laterality Date  . CESAREAN SECTION    . CESAREAN SECTION N/A 06/29/2016   Procedure: CESAREAN SECTION;  Surgeon: Shea Evans, MD;  Location: [facility];  Service: Obstetrics;  Laterality: N/A;  . CHOLECYSTECTOMY    . KNEE ARTHROSCOPY Left   . LAPAROSCOPY     twice  . NASAL SINUS SURGERY    . WISDOM TOOTH EXTRACTION      OB History    Gravida Para Term Preterm AB Living   2 2   2   2    SAB TAB Ectopic Multiple Live Births         0 1       Home Medications    Prior to Admission medications   Medication Sig Start Date End Date Taking? Authorizing Provider  ibuprofen (ADVIL,MOTRIN) 600 MG tablet Take 1 tablet (600 mg total) by mouth every 6 (six) hours. 07/01/16   Marlinda Mike, CNM  iron polysaccharides (NIFEREX) 150 MG capsule Take 1 capsule (150 mg total) by mouth 2 (two) times daily. 07/01/16   Marlinda Mike, CNM  magnesium oxide (MAG-OX) 400 (241.3 Mg) MG tablet Take 1 tablet (400 mg total) by mouth daily. 07/01/16   Marlinda Mike, CNM  oxyCODONE-acetaminophen (PERCOCET/ROXICET) 5-325 MG tablet Take 1 tablet by mouth every 4 (four) hours as needed (pain scale 4-7). 07/01/16   Marlinda Mike, CNM  Prenat-FeFmCb-DSS-FA-DHA w/o A Cherlyn Labella  HARMONY) 27-1-260 MG CAPS Take 1 capsule by mouth at bedtime.    Historical Provider, MD    Family History Family History  Problem Relation Age of Onset  . Diabetes Mother   . Diabetes Maternal Grandfather   . Heart disease Maternal Grandfather   . Diabetes Paternal Grandfather   . Aneurysm Paternal Aunt     Social History Social History  Substance Use Topics  . Smoking status: Never Smoker  . Smokeless  tobacco: Never Used  . Alcohol use No     Allergies   Augmentin [amoxicillin-pot clavulanate]; Biaxin [clarithromycin]; and Keflex [cephalexin]   Review of Systems Review of Systems  Constitutional: Negative for diaphoresis.  HENT: Positive for sore throat.   Respiratory: Negative for shortness of breath.   Cardiovascular: Positive for chest pain.  Gastrointestinal: Positive for vomiting. Negative for nausea.     Physical Exam Updated Vital Signs There were no vitals taken for this visit.  Physical Exam  Constitutional: She is oriented to person, place, and time. She appears well-developed and well-nourished.  HENT:  Head: Normocephalic and atraumatic.  Eyes: EOM are normal. Pupils are equal, round, and reactive to light.  Neck: Normal range of motion. Neck supple. No JVD present.  Cardiovascular: Normal rate, regular rhythm and normal heart sounds.   No murmur heard. Pulmonary/Chest: Effort normal and breath sounds normal. She has no wheezes. She has no rales. She exhibits no tenderness.  Abdominal: Soft. Bowel sounds are normal. She exhibits no distension and no mass. There is no tenderness.  Musculoskeletal: Normal range of motion. She exhibits no edema.  Lymphadenopathy:    She has no cervical adenopathy.  Neurological: She is alert and oriented to person, place, and time. No cranial nerve deficit. She exhibits normal muscle tone. Coordination normal.  Skin: Skin is warm and dry. No rash noted.  Psychiatric: She has a normal mood and affect. Her behavior is normal. Judgment and thought content normal.  Nursing note and vitals reviewed.    ED Treatments / Results   COORDINATION OF CARE: 12:38 AM Discussed treatment plan with pt at bedside and pt agreed to plan.  Labs (all labs ordered are listed, but only abnormal results are displayed) Labs Reviewed  BASIC METABOLIC PANEL - Abnormal; Notable for the following:       Result Value   Glucose, Bld 118 (*)    All  other components within normal limits  CBC  I-STAT TROPOININ, ED    EKG  EKG Interpretation  Date/Time:  Friday October 09 2016 22:09:28 EST Ventricular Rate:  97 PR Interval:  128 QRS Duration: 88 QT Interval:  378 QTC Calculation: 480 R Axis:   97 Text Interpretation:  Normal sinus rhythm Rightward axis Borderline Prolonged QT Abnormal ECG No old tracing to compare Confirmed by [facility]  MD, [place] Duerst ([id number]) on 10/09/2016 11:02:43 PM       Radiology Dg Chest 2 View  Result Date: 10/09/2016 CLINICAL DATA:  Onset of central chest pain accompanied by nausea and vomiting. Attempted to dry swallow a small pill prior to onset of chest pain. EXAM: CHEST  2 VIEW COMPARISON:  08/13/2013 CXR FINDINGS: The heart size and mediastinal contours are within normal limits. Both lungs are clear. The visualized skeletal structures are unremarkable. No radiopaque foreign body noted. IMPRESSION: No active cardiopulmonary disease. Electronically Signed   By: Tollie Eth M.D.   On: 10/09/2016 23:00    Procedures Procedures (including critical care time)  Medications Ordered in ED Medications -  No data to display   Initial Impression / Assessment and Plan / ED Course  I have reviewed the triage vital signs and the nursing notes.  Pertinent labs & imaging results that were available during my care of the patient were reviewed by me and considered in my medical decision making (see chart for details).   chest pain which has resolved. Pattern is not especially concerning for coronary artery disease. Heart Score=0, which places her at extremely low risk of major adverse cardiovascular events in the next 30 days. She is greater than 3 months after cesarean birth, so she is no longer at increased risk for pulmonary embolism. She is completely symptom-free currently. ECG shows no acute changes, troponin is normal, chest x-ray normal. Review of old records confirms cesarean birth on November 20. She was anemic at the  time of hospitalization, but hemoglobin is normal now. Blood glucose is 118. Patient advised of this and including need for follow-up as an outpatient. Patient is reassured regarding negative workup and is discharged with instructions to follow-up with PCP.  Final Clinical Impressions(s) / ED Diagnoses   Final diagnoses:  Nonspecific chest pain    New Prescriptions New Prescriptions   No medications on file   I personally performed the services described in this documentation, which was scribed in my presence. The recorded information has been reviewed and is accurate.       Dione Boozeavid Taneya Conkel, MD 10/10/16 [phone]
